# Patient Record
Sex: Female | Born: 1960
Health system: Southern US, Community
[De-identification: ages and names within clinical notes are randomized; demographics above are authoritative.]

## PROBLEM LIST (undated history)

## (undated) DIAGNOSIS — F909 Attention-deficit hyperactivity disorder, unspecified type: Secondary | ICD-10-CM

## (undated) HISTORY — PX: TYMPANOPLASTY: SHX33

## (undated) HISTORY — DX: Attention-deficit hyperactivity disorder, unspecified type: F90.9

## (undated) HISTORY — PX: DILATION AND CURETTAGE OF UTERUS: SHX78

---

## 2005-11-27 ENCOUNTER — Other Ambulatory Visit: Admission: RE | Admit: 2005-11-27 | Discharge: 2005-11-27 | Payer: Self-pay | Admitting: Gynecology

## 2006-01-13 ENCOUNTER — Encounter: Admission: RE | Admit: 2006-01-13 | Discharge: 2006-01-13 | Payer: Self-pay | Admitting: Gynecology

## 2006-11-30 ENCOUNTER — Other Ambulatory Visit: Admission: RE | Admit: 2006-11-30 | Discharge: 2006-11-30 | Payer: Self-pay | Admitting: Gynecology

## 2008-03-05 ENCOUNTER — Other Ambulatory Visit: Admission: RE | Admit: 2008-03-05 | Discharge: 2008-03-05 | Payer: Self-pay | Admitting: Gynecology

## 2008-03-23 ENCOUNTER — Encounter: Admission: RE | Admit: 2008-03-23 | Discharge: 2008-03-23 | Payer: Self-pay | Admitting: Gynecology

## 2008-05-06 ENCOUNTER — Emergency Department (HOSPITAL_COMMUNITY): Admission: EM | Admit: 2008-05-06 | Discharge: 2008-05-06 | Payer: Self-pay | Admitting: Family Medicine

## 2008-10-13 ENCOUNTER — Emergency Department (HOSPITAL_COMMUNITY): Admission: EM | Admit: 2008-10-13 | Discharge: 2008-10-13 | Payer: Self-pay | Admitting: Family Medicine

## 2008-11-12 ENCOUNTER — Emergency Department (HOSPITAL_COMMUNITY): Admission: EM | Admit: 2008-11-12 | Discharge: 2008-11-12 | Payer: Self-pay | Admitting: Emergency Medicine

## 2009-05-30 ENCOUNTER — Encounter: Admission: RE | Admit: 2009-05-30 | Discharge: 2009-05-30 | Payer: Self-pay | Admitting: Gynecology

## 2009-05-30 ENCOUNTER — Ambulatory Visit: Payer: Self-pay | Admitting: Gynecology

## 2009-05-30 ENCOUNTER — Other Ambulatory Visit: Admission: RE | Admit: 2009-05-30 | Discharge: 2009-05-30 | Payer: Self-pay | Admitting: Gynecology

## 2009-05-30 ENCOUNTER — Encounter: Payer: Self-pay | Admitting: Gynecology

## 2011-01-11 ENCOUNTER — Encounter: Payer: Self-pay | Admitting: Gynecology

## 2011-07-26 ENCOUNTER — Inpatient Hospital Stay (INDEPENDENT_AMBULATORY_CARE_PROVIDER_SITE_OTHER)
Admission: RE | Admit: 2011-07-26 | Discharge: 2011-07-26 | Disposition: A | Discharge status: Home / Self Care | Payer: Self-pay | Source: Ambulatory Visit | Attending: Family Medicine | Admitting: Family Medicine

## 2011-07-26 DIAGNOSIS — H9209 Otalgia, unspecified ear: Secondary | ICD-10-CM

## 2011-08-02 ENCOUNTER — Inpatient Hospital Stay (INDEPENDENT_AMBULATORY_CARE_PROVIDER_SITE_OTHER)
Admission: RE | Admit: 2011-08-02 | Discharge: 2011-08-02 | Disposition: A | Discharge status: Home / Self Care | Payer: BC Managed Care – PPO | Source: Ambulatory Visit | Attending: Family Medicine | Admitting: Family Medicine

## 2011-08-02 DIAGNOSIS — H6690 Otitis media, unspecified, unspecified ear: Secondary | ICD-10-CM

## 2013-07-13 ENCOUNTER — Other Ambulatory Visit: Payer: Self-pay

## 2013-07-13 DIAGNOSIS — Z1231 Encounter for screening mammogram for malignant neoplasm of breast: Secondary | ICD-10-CM

## 2013-08-04 ENCOUNTER — Ambulatory Visit: Payer: Self-pay

## 2013-08-11 ENCOUNTER — Ambulatory Visit
Admission: RE | Admit: 2013-08-11 | Discharge: 2013-08-11 | Disposition: A | Discharge status: Home / Self Care | Payer: BC Managed Care – PPO | Source: Ambulatory Visit

## 2013-08-11 DIAGNOSIS — Z1231 Encounter for screening mammogram for malignant neoplasm of breast: Secondary | ICD-10-CM

## 2013-08-15 ENCOUNTER — Other Ambulatory Visit: Payer: Self-pay | Admitting: Gynecology

## 2013-08-15 DIAGNOSIS — R928 Other abnormal and inconclusive findings on diagnostic imaging of breast: Secondary | ICD-10-CM

## 2013-08-17 ENCOUNTER — Ambulatory Visit (INDEPENDENT_AMBULATORY_CARE_PROVIDER_SITE_OTHER): Payer: BC Managed Care – PPO | Admitting: Gynecology

## 2013-08-17 ENCOUNTER — Other Ambulatory Visit (HOSPITAL_COMMUNITY)
Admission: RE | Admit: 2013-08-17 | Discharge: 2013-08-17 | Disposition: A | Discharge status: Home / Self Care | Payer: BC Managed Care – PPO | Source: Ambulatory Visit | Attending: Gynecology | Admitting: Gynecology

## 2013-08-17 ENCOUNTER — Encounter: Payer: Self-pay | Admitting: Gynecology

## 2013-08-17 VITALS — BP 120/76 | Ht 68.0 in | Wt 168.0 lb

## 2013-08-17 DIAGNOSIS — Z1151 Encounter for screening for human papillomavirus (HPV): Secondary | ICD-10-CM | POA: Insufficient documentation

## 2013-08-17 DIAGNOSIS — N926 Irregular menstruation, unspecified: Secondary | ICD-10-CM

## 2013-08-17 DIAGNOSIS — Z01419 Encounter for gynecological examination (general) (routine) without abnormal findings: Secondary | ICD-10-CM | POA: Insufficient documentation

## 2013-08-17 DIAGNOSIS — N951 Menopausal and female climacteric states: Secondary | ICD-10-CM

## 2013-08-17 MED ORDER — PROGESTERONE MICRONIZED 200 MG PO CAPS: 200.0000 mg | ORAL_CAPSULE | Freq: Every day | ORAL | Status: DC

## 2013-08-17 MED ORDER — ESTRADIOL 0.1 MG/24HR TD PTTW: 1.0000 | MEDICATED_PATCH | TRANSDERMAL | Status: DC

## 2013-08-17 NOTE — Progress Notes (Signed)
Nicole Boyd 10-26-1961 [id number]        52 y.o.  G0P0 for annual exam.  Has not been seen in over 3 years. Several issues noted below.  Past medical history,surgical history, medications, allergies, family history and social history were all reviewed and documented in the EPIC chart.  ROS:  Performed and pertinent positives and negatives are included in the history, assessment and plan .  Exam: Kim assistant Filed Vitals:   08/17/13 1001  BP: 120/76  Height: 5\' 8"  (1.727 m)  Weight: 168 lb (76.204 kg)   General appearance  Normal Skin grossly normal Head/Neck normal with no cervical or supraclavicular adenopathy thyroid normal Lungs  clear Cardiac RR, without RMG Abdominal  soft, nontender, without masses, organomegaly or hernia Breasts  examined lying and sitting without masses, retractions, discharge or axillary adenopathy. Pelvic  Ext/BUS/vagina  normal  Cervix  normal Pap/HPV  Uterus  anteverted, normal size, shape and contour, midline and mobile nontender   Adnexa  Without masses or tenderness    Anus and perineum  normal   Rectovaginal  normal sphincter tone without palpated masses or tenderness.    Assessment/Plan:  52 y.o. G0P0 female for annual exam, irregular menses, not sexually active.   1. Irregular menses/menopausal symptoms. Over the past year or so patient notes increasing hot flushes, night sweats, sleep intolerance, emotional swings and skips in her menses up to several months. Are regular when they come with no prolonged or atypical bleeding. Reviewed perimenopausal status. We'll check baseline FSH TSH for completeness. Options for management to include observation, OTC products, HRT discussed.  I reviewed the whole issue of HRT with her to include the WHI study with increased risk of stroke, heart attack, DVT and breast cancer. The ACOG and NAMS statements for lowest dose for the shortest period of time reviewed. Transdermal versus oral first-pass effect  benefit discussed. After lengthy discussion the patient wants to start on HRT. I suggested Minivelle 0.1 mg patches and Prometrium 200 mg daily for the first 12 days of each month. As she is having some withdrawal bleeding will try to regulate her this coming year. We'll consider switching to a continuous regimen once we get further into the menopause. Notes father does have history of DVT with no other family history. Occurred when he was elderly and he was in poor health with long history of cigarette smoking. Possible genetic linkage discuss but with out other family members and given this situation feel no further evaluation needed. Patient agrees and does accept the increased risk of thrombosis associated with HRT. 2. Pap smear 2010. Pap smear/HPV done today. No history of abnormal Pap smears previously. Assuming normal then we'll plan less frequent screening intervals every 3-5 years. 3. Mammography 07/2013. Patient going back for additional views and has this scheduled rule out mass. Exam is normal today. Assuming negative followup views and plan annual mammography. SBE monthly reviewed. 4. Colonoscopy never. Recommended screening colonoscopy this coming year. 5. Health maintenance. No other blood work done as it is reportedly done through her primary physician's office. Followup one year, sooner as needed.  Note: This document was prepared with digital dictation and possible smart phrase technology. Any transcriptional errors that result from this process are unintentional.   Dara Lords MD, 10:57 AM 08/17/2013

## 2013-08-17 NOTE — Patient Instructions (Addendum)
Start on hormone replacement as we discussed. Call me if you have any issues or irregular bleeding. Call me regardless to see how you're doing in 3-6 months. Followup in one year for annual exam. Schedule colonoscopy with [facility] gastroenterology at [phone] or [facility] gastroenterology at [phone]

## 2013-08-22 ENCOUNTER — Telehealth: Payer: Self-pay

## 2013-08-22 NOTE — Telephone Encounter (Signed)
Patient said she has already started using the patch the day she was here. She said the day that she started that she began with hot flashes. It had been awhile since she had them.  She wondered if this was a side affect of the patch with starting it.  I told her I had never heard of that but I would check with you. She has continued with hotflashes daily.

## 2013-08-22 NOTE — Telephone Encounter (Signed)
Is not a side effect of the patch. The patch should be making her hot flushes better. It usually takes several weeks to completely take effect and it may just be coincidence that her hot flashes got worse.

## 2013-08-22 NOTE — Telephone Encounter (Signed)
Message copied by Keenan Bachelor on Tue Aug 22, 2013 11:31 AM ------      Message from: Dara Lords      Created: Fri Aug 18, 2013  8:50 AM       Tell patient that her [facility] is mildly elevated to assist with the beginnings of menopause which all make sense given her clinical picture. I would start HRT as we discussed and followup as we discussed. ------

## 2013-08-22 NOTE — Telephone Encounter (Signed)
Patient advised.

## 2013-08-29 ENCOUNTER — Ambulatory Visit
Admission: RE | Admit: 2013-08-29 | Discharge: 2013-08-29 | Disposition: A | Discharge status: Home / Self Care | Payer: BC Managed Care – PPO | Source: Ambulatory Visit | Attending: Gynecology | Admitting: Gynecology

## 2013-08-29 DIAGNOSIS — R928 Other abnormal and inconclusive findings on diagnostic imaging of breast: Secondary | ICD-10-CM

## 2013-09-05 ENCOUNTER — Other Ambulatory Visit: Payer: BC Managed Care – PPO

## 2014-02-16 ENCOUNTER — Telehealth: Payer: Self-pay | Admitting: *Deleted

## 2014-02-16 NOTE — Telephone Encounter (Signed)
Pt called requesting another coupon for minivelle patch, I mail coupon to patient,old one had expired.

## 2014-05-22 ENCOUNTER — Telehealth: Payer: Self-pay | Admitting: *Deleted

## 2014-05-22 NOTE — Telephone Encounter (Signed)
Spoke with patient about the below note and decided to monitor symptoms,but pt states she never started the Prometrium day 1-12 monthly. I explained to patient that she really needs to take this. Pt said she did remember about this medication, so I explained to patient. I told her I would relay to you. Pt is going to stick with patch and start taking Prometrium today.

## 2014-05-22 NOTE — Telephone Encounter (Signed)
That is the highest dose for the patch. To increase the estrogen she would need to switch to the pills which have a slightly higher risk of blood clots. If she wants to do that then I would prescribe estradiol 1 mg #60 and take 1-1/2 pill daily along with her Prometrium and she has been doing. Alternative would be to monitor her symptoms a little longer to see if this does not improve on their own.

## 2014-05-22 NOTE — Telephone Encounter (Signed)
Pt currently taking vivelle dot patches 0.1 mg noticed hot flashes x1 week and night sweats, problems with sleep x 1 month and slight headaches, feels emotional did mention somewhat stressed. Pt asked if increase in dose should be prescribed? Please advise

## 2014-05-23 MED ORDER — PROGESTERONE MICRONIZED 200 MG PO CAPS: 200.0000 mg | ORAL_CAPSULE | Freq: Every day | ORAL | Status: DC

## 2014-06-15 ENCOUNTER — Telehealth: Payer: Self-pay | Admitting: *Deleted

## 2014-06-15 NOTE — Telephone Encounter (Signed)
Just FYI Pt currently taking estradiol patch 0.1 mg and progesterone 200 mg day 1-12 pt is going to stop HRT. Has annual in September 2015, will follow up with you then. She would like to see how body does without HRT.

## 2014-07-06 ENCOUNTER — Other Ambulatory Visit: Payer: Self-pay | Admitting: Gastroenterology

## 2014-07-31 ENCOUNTER — Other Ambulatory Visit: Payer: Self-pay

## 2014-07-31 DIAGNOSIS — Z1231 Encounter for screening mammogram for malignant neoplasm of breast: Secondary | ICD-10-CM

## 2014-08-16 ENCOUNTER — Ambulatory Visit
Admission: RE | Admit: 2014-08-16 | Discharge: 2014-08-16 | Disposition: A | Discharge status: Home / Self Care | Payer: BC Managed Care – PPO | Source: Ambulatory Visit

## 2014-08-16 DIAGNOSIS — Z1231 Encounter for screening mammogram for malignant neoplasm of breast: Secondary | ICD-10-CM

## 2014-08-23 ENCOUNTER — Ambulatory Visit (INDEPENDENT_AMBULATORY_CARE_PROVIDER_SITE_OTHER): Payer: BC Managed Care – PPO | Admitting: Gynecology

## 2014-08-23 ENCOUNTER — Encounter: Payer: Self-pay | Admitting: Gynecology

## 2014-08-23 VITALS — BP 116/70 | Ht 68.0 in | Wt 148.0 lb

## 2014-08-23 DIAGNOSIS — Z01419 Encounter for gynecological examination (general) (routine) without abnormal findings: Secondary | ICD-10-CM

## 2014-08-23 NOTE — Patient Instructions (Signed)
You may obtain a copy of any labs that were done today by logging onto MyChart as outlined in the instructions provided with your AVS (after visit summary). The office will not call with normal lab results but certainly if there are any significant abnormalities then we will contact you.   Health Maintenance, Female A healthy lifestyle and preventative care can promote health and wellness.  Maintain regular health, dental, and eye exams.  Eat a healthy diet. Foods like vegetables, fruits, whole grains, low-fat dairy products, and lean protein foods contain the nutrients you need without too many calories. Decrease your intake of foods high in solid fats, added sugars, and salt. Get information about a proper diet from your caregiver, if necessary.  Regular physical exercise is one of the most important things you can do for your health. Most adults should get at least 150 minutes of moderate-intensity exercise (any activity that increases your heart rate and causes you to sweat) each week. In addition, most adults need muscle-strengthening exercises on 2 or more days a week.   Maintain a healthy weight. The body mass index (BMI) is a screening tool to identify possible weight problems. It provides an estimate of body fat based on height and weight. Your caregiver can help determine your BMI, and can help you achieve or maintain a healthy weight. For adults 20 years and older:  A BMI below 18.5 is considered underweight.  A BMI of 18.5 to 24.9 is normal.  A BMI of 25 to 29.9 is considered overweight.  A BMI of 30 and above is considered obese.  Maintain normal blood lipids and cholesterol by exercising and minimizing your intake of saturated fat. Eat a balanced diet with plenty of fruits and vegetables. Blood tests for lipids and cholesterol should begin at age 61 and be repeated every 5 years. If your lipid or cholesterol levels are high, you are over 50, or you are a high risk for heart  disease, you may need your cholesterol levels checked more frequently.Ongoing high lipid and cholesterol levels should be treated with medicines if diet and exercise are not effective.  If you smoke, find out from your caregiver how to quit. If you do not use tobacco, do not start.  Lung cancer screening is recommended for adults aged 33 80 years who are at high risk for developing lung cancer because of a history of smoking. Yearly low-dose computed tomography (CT) is recommended for people who have at least a 30-pack-year history of smoking and are a current smoker or have quit within the past 15 years. A pack year of smoking is smoking an average of 1 pack of cigarettes a day for 1 year (for example: 1 pack a day for 30 years or 2 packs a day for 15 years). Yearly screening should continue until the smoker has stopped smoking for at least 15 years. Yearly screening should also be stopped for people who develop a health problem that would prevent them from having lung cancer treatment.  If you are pregnant, do not drink alcohol. If you are breastfeeding, be very cautious about drinking alcohol. If you are not pregnant and choose to drink alcohol, do not exceed 1 drink per day. One drink is considered to be 12 ounces (355 mL) of beer, 5 ounces (148 mL) of wine, or 1.5 ounces (44 mL) of liquor.  Avoid use of street drugs. Do not share needles with anyone. Ask for help if you need support or instructions about stopping  the use of drugs.  High blood pressure causes heart disease and increases the risk of stroke. Blood pressure should be checked at least every 1 to 2 years. Ongoing high blood pressure should be treated with medicines, if weight loss and exercise are not effective.  If you are 59 to 51 years old, ask your caregiver if you should take aspirin to prevent strokes.  Diabetes screening involves taking a blood sample to check your fasting blood sugar level. This should be done once every 3  years, after age 91, if you are within normal weight and without risk factors for diabetes. Testing should be considered at a younger age or be carried out more frequently if you are overweight and have at least 1 risk factor for diabetes.  Breast cancer screening is essential preventative care for women. You should practice "breast self-awareness." This means understanding the normal appearance and feel of your breasts and may include breast self-examination. Any changes detected, no matter how small, should be reported to a caregiver. Women in their 66s and 30s should have a clinical breast exam (CBE) by a caregiver as part of a regular health exam every 1 to 3 years. After age 101, women should have a CBE every year. Starting at age 100, women should consider having a mammogram (breast X-ray) every year. Women who have a family history of breast cancer should talk to their caregiver about genetic screening. Women at a high risk of breast cancer should talk to their caregiver about having an MRI and a mammogram every year.  Breast cancer gene (BRCA)-related cancer risk assessment is recommended for women who have family members with BRCA-related cancers. BRCA-related cancers include breast, ovarian, tubal, and peritoneal cancers. Having family members with these cancers may be associated with an increased risk for harmful changes (mutations) in the breast cancer genes BRCA1 and BRCA2. Results of the assessment will determine the need for genetic counseling and BRCA1 and BRCA2 testing.  The Pap test is a screening test for cervical cancer. Women should have a Pap test starting at age 57. Between ages 25 and 35, Pap tests should be repeated every 2 years. Beginning at age 37, you should have a Pap test every 3 years as long as the past 3 Pap tests have been normal. If you had a hysterectomy for a problem that was not cancer or a condition that could lead to cancer, then you no longer need Pap tests. If you are  between ages 50 and 76, and you have had normal Pap tests going back 10 years, you no longer need Pap tests. If you have had past treatment for cervical cancer or a condition that could lead to cancer, you need Pap tests and screening for cancer for at least 20 years after your treatment. If Pap tests have been discontinued, risk factors (such as a new sexual partner) need to be reassessed to determine if screening should be resumed. Some women have medical problems that increase the chance of getting cervical cancer. In these cases, your caregiver may recommend more frequent screening and Pap tests.  The human papillomavirus (HPV) test is an additional test that may be used for cervical cancer screening. The HPV test looks for the virus that can cause the cell changes on the cervix. The cells collected during the Pap test can be tested for HPV. The HPV test could be used to screen women aged 44 years and older, and should be used in women of any age  who have unclear Pap test results. After the age of 55, women should have HPV testing at the same frequency as a Pap test.  Colorectal cancer can be detected and often prevented. Most routine colorectal cancer screening begins at the age of 44 and continues through age 20. However, your caregiver may recommend screening at an earlier age if you have risk factors for colon cancer. On a yearly basis, your caregiver may provide home test kits to check for hidden blood in the stool. Use of a small camera at the end of a tube, to directly examine the colon (sigmoidoscopy or colonoscopy), can detect the earliest forms of colorectal cancer. Talk to your caregiver about this at age 86, when routine screening begins. Direct examination of the colon should be repeated every 5 to 10 years through age 13, unless early forms of pre-cancerous polyps or small growths are found.  Hepatitis C blood testing is recommended for all people born from [birth year] through 1963 and any  individual with known risks for hepatitis C.  Practice safe sex. Use condoms and avoid high-risk sexual practices to reduce the spread of sexually transmitted infections (STIs). Sexually active women aged 36 and younger should be checked for Chlamydia, which is a common sexually transmitted infection. Older women with new or multiple partners should also be tested for Chlamydia. Testing for other STIs is recommended if you are sexually active and at increased risk.  Osteoporosis is a disease in which the bones lose minerals and strength with aging. This can result in serious bone fractures. The risk of osteoporosis can be identified using a bone density scan. Women ages 20 and over and women at risk for fractures or osteoporosis should discuss screening with their caregivers. Ask your caregiver whether you should be taking a calcium supplement or vitamin D to reduce the rate of osteoporosis.  Menopause can be associated with physical symptoms and risks. Hormone replacement therapy is available to decrease symptoms and risks. You should talk to your caregiver about whether hormone replacement therapy is right for you.  Use sunscreen. Apply sunscreen liberally and repeatedly throughout the day. You should seek shade when your shadow is shorter than you. Protect yourself by wearing long sleeves, pants, a wide-brimmed hat, and sunglasses year round, whenever you are outdoors.  Notify your caregiver of new moles or changes in moles, especially if there is a change in shape or color. Also notify your caregiver if a mole is larger than the size of a pencil eraser.  Stay current with your immunizations. Document Released: 06/22/2011 Document Revised: 04/03/2013 Document Reviewed: 06/22/2011 [facility] Patient Information 2014 [employer].

## 2014-08-23 NOTE — Progress Notes (Signed)
Kaysey Berndt Bents May 21, 1961 [id number]        53 y.o.  G0P0 for annual exam.  Doing well without complaints.  Past medical history,surgical history, problem list, medications, allergies, family history and social history were all reviewed and documented as reviewed in the EPIC chart.  ROS:  12 system ROS performed with pertinent positives and negatives included in the history, assessment and plan.   Additional significant findings :  None   Exam: Kim assistant Filed Vitals:   08/23/14 0844  BP: 116/70  Height: 5\' 8"  (1.727 m)  Weight: 148 lb (67.132 kg)   General appearance:  Normal affect, orientation and appearance. Skin: Grossly normal HEENT: Without gross lesions.  No cervical or supraclavicular adenopathy. Thyroid normal.  Lungs:  Clear without wheezing, rales or rhonchi Cardiac: RR, without RMG Abdominal:  Soft, nontender, without masses, guarding, rebound, organomegaly or hernia Breasts:  Examined lying and sitting without masses, retractions, discharge or axillary adenopathy. Pelvic:  Ext/BUS/vagina normal  Cervix normal  Uterus anteverted, normal size, shape and contour, midline and mobile nontender   Adnexa  Without masses or tenderness    Anus and perineum  Normal   Rectovaginal  Normal sphincter tone without palpated masses or tenderness.    Assessment/Plan:  53 y.o. G0P0 female for annual exam.   1. Postmenopausal. Without significant hot flushes, night sweats, vaginal dryness or dyspareunia. Transiently tried HRT last year but discontinued. Doing well and does not want to start. No vaginal bleeding for over one year. Continue to monitor and report any vaginal bleeding. 2. Pap smear/HPV negative  2014. No Pap smear done today. No history of significant abnormal Pap smears. Plan repeat at 3-5 year interval per current screening guidelines. 3. Mammography 07/2014. Continue with annual mammography. SBE monthly reviewed. 4. Colonoscopy 2015. Repeat at their recommended  interval. 5. DEXA. Plan further into the menopause. Increase calcium vitamin D recommendations reviewed. 6. Health maintenance. No blood work done as she has this done at her primary physician's office. Followup one year, sooner as needed.    Note: This document was prepared with digital dictation and possible smart phrase technology. Any transcriptional errors that result from this process are unintentional.   [name] MD, 9:15 AM 08/23/2014

## 2014-08-24 LAB — URINALYSIS W MICROSCOPIC + REFLEX CULTURE
BILIRUBIN URINE: NEGATIVE
Bacteria, UA: NONE SEEN
CASTS: NONE SEEN
CRYSTALS: NONE SEEN
Glucose, UA: NEGATIVE mg/dL
Hgb urine dipstick: NEGATIVE
KETONES UR: NEGATIVE mg/dL
LEUKOCYTES UA: NEGATIVE
NITRITE: NEGATIVE
Protein, ur: NEGATIVE mg/dL
SPECIFIC GRAVITY, URINE: 1.02 (ref 1.005–1.030)
SQUAMOUS EPITHELIAL / LPF: NONE SEEN
Urobilinogen, UA: 0.2 mg/dL (ref 0.0–1.0)
pH: 5.5 (ref 5.0–8.0)

## 2015-06-10 ENCOUNTER — Telehealth: Payer: Self-pay | Admitting: *Deleted

## 2015-06-10 NOTE — Telephone Encounter (Signed)
Nicole Boyd

## 2015-06-10 NOTE — Telephone Encounter (Signed)
Pt informed with the below note. 

## 2015-06-10 NOTE — Telephone Encounter (Signed)
Pt called c/o increased hot flashes was on HRT in past but stopped. Pt said she doesn't want to take a Rx,but asked if you know of any OTC medication that may help? Please advise

## 2015-11-06 ENCOUNTER — Encounter: Payer: Self-pay | Admitting: Gynecology

## 2015-11-22 ENCOUNTER — Other Ambulatory Visit: Payer: Self-pay

## 2015-11-22 DIAGNOSIS — Z1231 Encounter for screening mammogram for malignant neoplasm of breast: Secondary | ICD-10-CM

## 2015-12-02 ENCOUNTER — Encounter (HOSPITAL_BASED_OUTPATIENT_CLINIC_OR_DEPARTMENT_OTHER): Payer: Self-pay | Admitting: *Deleted

## 2015-12-03 ENCOUNTER — Other Ambulatory Visit: Payer: Self-pay | Admitting: Orthopedic Surgery

## 2015-12-05 ENCOUNTER — Ambulatory Visit (HOSPITAL_BASED_OUTPATIENT_CLINIC_OR_DEPARTMENT_OTHER): Payer: 59 | Admitting: Anesthesiology

## 2015-12-05 ENCOUNTER — Ambulatory Visit (HOSPITAL_BASED_OUTPATIENT_CLINIC_OR_DEPARTMENT_OTHER)
Admission: RE | Admit: 2015-12-05 | Discharge: 2015-12-05 | Disposition: A | Discharge status: Home / Self Care | Payer: 59 | Source: Ambulatory Visit | Attending: Orthopedic Surgery | Admitting: Orthopedic Surgery

## 2015-12-05 ENCOUNTER — Encounter (HOSPITAL_BASED_OUTPATIENT_CLINIC_OR_DEPARTMENT_OTHER)
Admission: RE | Disposition: A | Discharge status: Home / Self Care | Payer: Self-pay | Source: Ambulatory Visit | Attending: Orthopedic Surgery

## 2015-12-05 ENCOUNTER — Encounter (HOSPITAL_BASED_OUTPATIENT_CLINIC_OR_DEPARTMENT_OTHER): Payer: Self-pay | Admitting: *Deleted

## 2015-12-05 DIAGNOSIS — G5601 Carpal tunnel syndrome, right upper limb: Secondary | ICD-10-CM | POA: Diagnosis present

## 2015-12-05 HISTORY — PX: CARPAL TUNNEL RELEASE: SHX101

## 2015-12-05 SURGERY — CARPAL TUNNEL RELEASE
Anesthesia: Monitor Anesthesia Care | Site: Hand | Laterality: Right

## 2015-12-05 MED ORDER — PROPOFOL 10 MG/ML IV BOLUS
INTRAVENOUS | Status: AC
  Filled 2015-12-05: qty 20

## 2015-12-05 MED ORDER — FENTANYL CITRATE (PF) 100 MCG/2ML IJ SOLN: 25.0000 ug | INTRAMUSCULAR | Status: DC | PRN

## 2015-12-05 MED ORDER — CEFAZOLIN SODIUM-DEXTROSE 2-3 GM-% IV SOLR: 2.0000 g | INTRAVENOUS | Status: DC

## 2015-12-05 MED ORDER — ONDANSETRON HCL 4 MG/2ML IJ SOLN
INTRAMUSCULAR | Status: DC | PRN
  Administered 2015-12-05: 4 mg via INTRAVENOUS

## 2015-12-05 MED ORDER — PROMETHAZINE HCL 25 MG/ML IJ SOLN: 6.2500 mg | INTRAMUSCULAR | Status: DC | PRN

## 2015-12-05 MED ORDER — LACTATED RINGERS IV SOLN
INTRAVENOUS | Status: DC
  Administered 2015-12-05: 11:00:00 via INTRAVENOUS

## 2015-12-05 MED ORDER — CHLORHEXIDINE GLUCONATE 4 % EX LIQD: 60.0000 mL | Freq: Once | CUTANEOUS | Status: DC

## 2015-12-05 MED ORDER — BUPIVACAINE HCL (PF) 0.25 % IJ SOLN
INTRAMUSCULAR | Status: AC
  Filled 2015-12-05: qty 120

## 2015-12-05 MED ORDER — HYDROCODONE-ACETAMINOPHEN 5-325 MG PO TABS
ORAL_TABLET | ORAL | Status: AC
  Filled 2015-12-05: qty 1

## 2015-12-05 MED ORDER — SCOPOLAMINE 1 MG/3DAYS TD PT72: 1.0000 | MEDICATED_PATCH | Freq: Once | TRANSDERMAL | Status: DC

## 2015-12-05 MED ORDER — MIDAZOLAM HCL 2 MG/2ML IJ SOLN
INTRAMUSCULAR | Status: AC
  Filled 2015-12-05: qty 2

## 2015-12-05 MED ORDER — PROPOFOL 500 MG/50ML IV EMUL
INTRAVENOUS | Status: DC | PRN
  Administered 2015-12-05: 75 ug/kg/min via INTRAVENOUS

## 2015-12-05 MED ORDER — HYDROCODONE-ACETAMINOPHEN 5-325 MG PO TABS
1.0000 | ORAL_TABLET | Freq: Once | ORAL | Status: AC
  Administered 2015-12-05: 1 via ORAL

## 2015-12-05 MED ORDER — CEFAZOLIN SODIUM-DEXTROSE 2-3 GM-% IV SOLR
2.0000 g | INTRAVENOUS | Status: AC
  Administered 2015-12-05: 2 g via INTRAVENOUS

## 2015-12-05 MED ORDER — CEFAZOLIN SODIUM-DEXTROSE 2-3 GM-% IV SOLR
INTRAVENOUS | Status: AC
  Filled 2015-12-05: qty 50

## 2015-12-05 MED ORDER — HYDROCODONE-ACETAMINOPHEN 5-325 MG PO TABS: 1.0000 | ORAL_TABLET | Freq: Four times a day (QID) | ORAL | Status: DC | PRN

## 2015-12-05 MED ORDER — FENTANYL CITRATE (PF) 100 MCG/2ML IJ SOLN
50.0000 ug | INTRAMUSCULAR | Status: DC | PRN
  Administered 2015-12-05: 50 ug via INTRAVENOUS

## 2015-12-05 MED ORDER — ONDANSETRON HCL 4 MG/2ML IJ SOLN
INTRAMUSCULAR | Status: AC
  Filled 2015-12-05: qty 2

## 2015-12-05 MED ORDER — FENTANYL CITRATE (PF) 100 MCG/2ML IJ SOLN
INTRAMUSCULAR | Status: AC
  Filled 2015-12-05: qty 2

## 2015-12-05 MED ORDER — BUPIVACAINE HCL (PF) 0.25 % IJ SOLN
INTRAMUSCULAR | Status: DC | PRN
  Administered 2015-12-05: 5 mL

## 2015-12-05 MED ORDER — MIDAZOLAM HCL 2 MG/2ML IJ SOLN
1.0000 mg | INTRAMUSCULAR | Status: DC | PRN
  Administered 2015-12-05 (×2): 1 mg via INTRAVENOUS

## 2015-12-05 MED ORDER — GLYCOPYRROLATE 0.2 MG/ML IJ SOLN: 0.2000 mg | Freq: Once | INTRAMUSCULAR | Status: DC | PRN

## 2015-12-05 SURGICAL SUPPLY — 33 items
BLADE SURG 15 STRL LF DISP TIS (BLADE) ×1 IMPLANT
BLADE SURG 15 STRL SS (BLADE) ×3
BNDG CMPR 9X4 STRL LF SNTH (GAUZE/BANDAGES/DRESSINGS)
BNDG COHESIVE 3X5 TAN STRL LF (GAUZE/BANDAGES/DRESSINGS) ×3 IMPLANT
BNDG ESMARK 4X9 LF (GAUZE/BANDAGES/DRESSINGS) IMPLANT
BNDG GAUZE ELAST 4 BULKY (GAUZE/BANDAGES/DRESSINGS) ×3 IMPLANT
CHLORAPREP W/TINT 26ML (MISCELLANEOUS) ×3 IMPLANT
CORDS BIPOLAR (ELECTRODE) ×3 IMPLANT
COVER BACK TABLE 60X90IN (DRAPES) ×3 IMPLANT
COVER MAYO STAND STRL (DRAPES) ×3 IMPLANT
CUFF TOURNIQUET SINGLE 18IN (TOURNIQUET CUFF) ×3 IMPLANT
DRAPE EXTREMITY T 121X128X90 (DRAPE) ×3 IMPLANT
DRAPE SURG 17X23 STRL (DRAPES) ×3 IMPLANT
DRSG PAD ABDOMINAL 8X10 ST (GAUZE/BANDAGES/DRESSINGS) ×3 IMPLANT
GAUZE SPONGE 4X4 12PLY STRL (GAUZE/BANDAGES/DRESSINGS) ×3 IMPLANT
GAUZE XEROFORM 1X8 LF (GAUZE/BANDAGES/DRESSINGS) ×3 IMPLANT
GLOVE BIOGEL PI IND STRL 8.5 (GLOVE) ×1 IMPLANT
GLOVE BIOGEL PI INDICATOR 8.5 (GLOVE) ×2
GLOVE SURG ORTHO 8.0 STRL STRW (GLOVE) ×3 IMPLANT
GOWN STRL REUS W/ TWL LRG LVL3 (GOWN DISPOSABLE) ×1 IMPLANT
GOWN STRL REUS W/TWL LRG LVL3 (GOWN DISPOSABLE) ×3
GOWN STRL REUS W/TWL XL LVL3 (GOWN DISPOSABLE) ×3 IMPLANT
NDL PRECISIONGLIDE 27X1.5 (NEEDLE) IMPLANT
NEEDLE PRECISIONGLIDE 27X1.5 (NEEDLE) ×3 IMPLANT
NS IRRIG 1000ML POUR BTL (IV SOLUTION) ×3 IMPLANT
PACK BASIN DAY SURGERY FS (CUSTOM PROCEDURE TRAY) ×3 IMPLANT
STOCKINETTE 4X48 STRL (DRAPES) ×3 IMPLANT
SUT ETHILON 4 0 PS 2 18 (SUTURE) ×3 IMPLANT
SUT VICRYL 4-0 PS2 18IN ABS (SUTURE) IMPLANT
SYR BULB 3OZ (MISCELLANEOUS) ×3 IMPLANT
SYR CONTROL 10ML LL (SYRINGE) ×2 IMPLANT
TOWEL OR 17X24 6PK STRL BLUE (TOWEL DISPOSABLE) ×3 IMPLANT
UNDERPAD 30X30 (UNDERPADS AND DIAPERS) ×3 IMPLANT

## 2015-12-05 NOTE — Anesthesia Postprocedure Evaluation (Signed)
Anesthesia Post Note  Patient: Jesse Mattson Zellner  Procedure(s) Performed: Procedure(s) (LRB): CARPAL TUNNEL RELEASE (Right)  Patient location during evaluation: PACU Anesthesia Type: MAC and Bier Block Level of consciousness: awake and alert Pain management: pain level controlled Vital Signs Assessment: post-procedure vital signs reviewed and stable Respiratory status: spontaneous breathing, nonlabored ventilation, respiratory function stable and patient connected to nasal cannula oxygen Cardiovascular status: stable and blood pressure returned to baseline Anesthetic complications: no    Last Vitals:  Filed Vitals:   12/05/15 1026 12/05/15 1245  BP: 103/66   Pulse: 62 55  Temp: 36.6 C 36.9 C  Resp: 16 11    Last Pain:  Filed Vitals:   12/05/15 1250  PainSc: [place] Turk

## 2015-12-05 NOTE — Transfer of Care (Signed)
Immediate Anesthesia Transfer of Care Note  Patient: Nicole Boyd  Procedure(s) Performed: Procedure(s): CARPAL TUNNEL RELEASE (Right)  Patient Location: PACU  Anesthesia Type:Bier block  Level of Consciousness: awake, alert , oriented and patient cooperative  Airway & Oxygen Therapy: Patient Spontanous Breathing  Post-op Assessment: Report given to RN and Post -op Vital signs reviewed and stable  Post vital signs: Reviewed and stable  Last Vitals:  Filed Vitals:   12/05/15 1026  BP: 103/66  Pulse: 62  Temp: 36.6 C  Resp: 16    Complications: No apparent anesthesia complications

## 2015-12-05 NOTE — Anesthesia Preprocedure Evaluation (Addendum)
Anesthesia Evaluation  Patient identified by MRN, date of birth, ID band Patient awake    Reviewed: Allergy & Precautions, NPO status , Patient's Chart, lab work & pertinent test results  History of Anesthesia Complications (+) PONV and history of anesthetic complications  Airway Mallampati: II  TM Distance: >3 FB Neck ROM: Full    Dental  (+) Teeth Intact, Dental Advisory Given   Pulmonary neg pulmonary ROS,    Pulmonary exam normal breath sounds clear to auscultation       Cardiovascular Exercise Tolerance: Good (-) hypertension(-) angina(-) CAD negative cardio ROS Normal cardiovascular exam Rhythm:Regular Rate:Normal     Neuro/Psych Carpal tunnel disease negative psych ROS   GI/Hepatic negative GI ROS, Neg liver ROS,   Endo/Other  negative endocrine ROS  Renal/GU negative Renal ROS     Musculoskeletal negative musculoskeletal ROS (+)   Abdominal   Peds  Hematology negative hematology ROS (+)   Anesthesia Other Findings Day of surgery medications reviewed with the patient.  Reproductive/Obstetrics negative OB ROS                            Anesthesia Physical Anesthesia Plan  ASA: I  Anesthesia Plan: Regional and MAC   Post-op Pain Management:    Induction: Intravenous  Airway Management Planned: Nasal Cannula  Additional Equipment:   Intra-op Plan:   Post-operative Plan:   Informed Consent: I have reviewed the patients History and Physical, chart, labs and discussed the procedure including the risks, benefits and alternatives for the proposed anesthesia with the patient or authorized representative who has indicated his/her understanding and acceptance.   Dental advisory given  Plan Discussed with:   Anesthesia Plan Comments: (Risks/benefits of regional block discussed with patient including risk of bleeding, infection, nerve damage, and possibility of failed block.   Also discussed backup plan of general anesthesia and associated risks.  Patient wishes to proceed.)       Anesthesia Quick Evaluation

## 2015-12-05 NOTE — Brief Op Note (Signed)
12/05/2015  12:38 PM  PATIENT:  Nicole Boyd  54 y.o. female  PRE-OPERATIVE DIAGNOSIS:  RIGHT CARPAL TUNNEL SYNDROME  POST-OPERATIVE DIAGNOSIS:  RIGHT CARPAL TUNNEL SYNDROME  PROCEDURE:  Procedure(s): CARPAL TUNNEL RELEASE (Right)  SURGEON:  Surgeon(s) and Role:    * Daryll Brod, MD - Primary  PHYSICIAN ASSISTANT:   ASSISTANTS: none   ANESTHESIA:   local and regional  EBL:     BLOOD ADMINISTERED:none  DRAINS: none   LOCAL MEDICATIONS USED:  BUPIVICAINE   SPECIMEN:  No Specimen  DISPOSITION OF SPECIMEN:  N/A  COUNTS:  YES  TOURNIQUET:   Total Tourniquet Time Documented: Forearm (Right) - 17 minutes Total: Forearm (Right) - 17 minutes   DICTATION: .Other Dictation: Dictation Number [id number]  PLAN OF CARE: Discharge to home after PACU  PATIENT DISPOSITION:  PACU - hemodynamically stable.

## 2015-12-05 NOTE — Discharge Instructions (Addendum)

## 2015-12-05 NOTE — H&P (Signed)
Nicole Boyd is an 55 y.o. female.   Chief Complaint: Nicole Boyd is a 54 year-old right-hand dominant former patient who has not been seen in over three years.  She comes in complaining of bilateral hand numbness, tingling and pain.  She has history of questionable carpal tunnel syndrome with early arthritis at the carpometacarpal joints of her thumbs bilaterally.  She states that this has gradually progressed.  She complains of an intermittent, moderate aching pain with a feeling of numbness to multiple fingers, right greater than left.  She has no history of diabetes or thyroid problems. She does have history of arthritis, no history of gout.  She is awakened occasionally at night. She has no history of injury to the hand or neck.  She is not taking anything for this, but has been wearing a brace.   ALLERGIES:   None.  MEDICATIONS:    Antibiotics (does not recall name) and hydrocodone (from recent ear surgery).  SURGICAL HISTORY:    Tympanoplasty with prosthetic implants (10/24/15).  FAMILY MEDICAL HISTORY:   Positive for heart disease, high blood pressure and arthritis.  SOCIAL HISTORY:     She does not smoke, drinks socially.  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  REVIEW OF SYSTEMS:   Positive for contacts, glasses, hearing loss, otherwise negative 14 points.   HPI: see above  Past Medical History  Diagnosis Date  . PONV (postoperative nausea and vomiting)     Past Surgical History  Procedure Laterality Date  . Dilation and curettage of uterus    . Tympanoplasty      Family History  Problem Relation Age of Onset  . Hypertension Mother   . Heart disease Mother   . Lung disease Mother   . Hypertension Father   . Heart disease Father   . Ovarian cancer Paternal Aunt    Social History:  reports that she has never smoked. She does not have any smokeless tobacco history on file. She reports that she drinks alcohol. She reports that she does  not use illicit drugs.  Allergies: No Known Allergies  No prescriptions prior to admission    No results found for this or any previous visit (from the past 48 hour(s)).  No results found.   Pertinent items are noted in HPI.  Blood pressure 103/66, pulse 62, temperature 97.9 F (36.6 C), temperature source Oral, resp. rate 16, height 5\' 8"  (1.727 m), weight 70.308 kg (155 lb), last menstrual period 08/14/2013, SpO2 100 %.  General appearance: alert, cooperative and appears stated age Head: Normocephalic, without obvious abnormality Neck: no JVD Resp: clear to auscultation bilaterally Cardio: regular rate and rhythm, S1, S2 normal, no murmur, click, rub or gallop GI: soft, non-tender; bowel sounds normal; no masses,  no organomegaly Extremities: numbness bilateral hands Pulses: 2+ and symmetric Skin: Skin color, texture, turgor normal. No rashes or lesions Neurologic: Grossly normal Incision/Wound: na  Assessment/Plan RADIOGRAPHS:     X-rays are deferred at this point in time.  CMC grind stress test produces no significant pain or discomfort, it is very mild in nature.  DIAGNOSIS:     carpal tunnel syndrome bilaterally.   PLAN: Release right carpal tunnel . This can be done as an outpatient. Pre, peri and post op care are discussed along with risks and complications. Patient is aware there is no guarantee with surgery, possibility of infection, injury to arteries, nerves, and tendons, incomplete relief and dystrophy. Tu Shimmel R 12/05/2015, 10:40  AM    

## 2015-12-05 NOTE — Op Note (Signed)
Dictation Number [id number]

## 2015-12-06 ENCOUNTER — Encounter (HOSPITAL_BASED_OUTPATIENT_CLINIC_OR_DEPARTMENT_OTHER): Payer: Self-pay | Admitting: Orthopedic Surgery

## 2015-12-06 NOTE — Op Note (Signed)
NAMEZYNAE, HEREK NO.:  [id number]  MEDICAL RECORD NO.:  [id number]  LOCATION:                                 FACILITY:  PHYSICIAN:  Daryll Brod, M.D.            DATE OF BIRTH:  DATE OF PROCEDURE:  12/05/2015 DATE OF DISCHARGE:                              OPERATIVE REPORT   PREOPERATIVE DIAGNOSIS:  Carpal tunnel syndrome, right hand.  POSTOPERATIVE DIAGNOSIS:  Carpal tunnel syndrome, right hand.  OPERATION:  Decompression of right median nerve.  SURGEON:  Daryll Brod, M.D.  ANESTHESIA:  Forearm-based IV regional with local infiltration.  ANESTHESIOLOGIST:  Dr. Gifford Shave.  HISTORY:  The patient is a 54 year old female with a history of bilateral carpal tunnel syndrome, very classic symptoms with numbness and tingling thumb through ring fingers, waking her at night, this has not responded to conservative treatment.  She has elected to undergo surgical decompression to the median nerve.  Pre, peri, and postoperative course have been discussed along with risks and complications.  She is aware that there is no guarantee with the surgery; possibility of infection; recurrence of injury to arteries, nerves, tendons; incomplete relief of symptoms; dystrophy.  In the preoperative area, the patient is seen, the extremity marked by both the patient and surgeon, and antibiotic given.  DESCRIPTION OF PROCEDURE:  The patient was brought to the operating room where a forearm-based IV regional anesthetic was carried out without difficulty.  She was prepped using ChloraPrep, supine position with the right arm free.  A 3-minute dry time was allowed.  Time-out taken, confirming the patient and procedure.  A longitudinal incision was made in the right palm, carried down through the subcutaneous tissue. Bleeders were electrocauterized with bipolar.  The palmar fascia was split.  Superficial palmar arch was identified.  The flexor tendon to the ring and little finger  identified.  Retractor was placed protecting median and ulnar nerve.  The carpal retinaculum was incised with sharp dissection.  Right angle and Sewall retractor were placed between skin and forearm fascia.  The fascia released for approximately 2 cm proximal to the wrist crease under direct vision.  Canal was explored.  An areaof compression to the nerve was apparent.  Motor branch entered into muscle distally, no further lesions were identified.  The wound was irrigated with saline and the skin closed with interrupted 4-0 nylon sutures.  A local infiltration with 0.25% bupivacaine without epinephrine was given, approximately 6 mL was used.  Sterile compressive dressing was applied.  On deflation of the tourniquet, all fingers were immediately pinked.  She was taken to the recovery room for observation in satisfactory condition.  She will be discharged to home to return to the Agency in 1 week, on [place].          ______________________________ Daryll Brod, M.D.     GK/MEDQ  D:  12/05/2015  T:  12/06/2015  Job:  [id number]

## 2015-12-11 ENCOUNTER — Ambulatory Visit
Admission: RE | Admit: 2015-12-11 | Discharge: 2015-12-11 | Disposition: A | Discharge status: Home / Self Care | Payer: 59 | Source: Ambulatory Visit

## 2015-12-11 DIAGNOSIS — Z1231 Encounter for screening mammogram for malignant neoplasm of breast: Secondary | ICD-10-CM

## 2016-01-03 ENCOUNTER — Encounter: Payer: Self-pay | Admitting: Gynecology

## 2016-01-03 ENCOUNTER — Ambulatory Visit (INDEPENDENT_AMBULATORY_CARE_PROVIDER_SITE_OTHER): Payer: BLUE CROSS/BLUE SHIELD | Admitting: Gynecology

## 2016-01-03 VITALS — BP 120/74 | Ht 68.0 in | Wt 155.0 lb

## 2016-01-03 DIAGNOSIS — Z01419 Encounter for gynecological examination (general) (routine) without abnormal findings: Secondary | ICD-10-CM

## 2016-01-03 DIAGNOSIS — N951 Menopausal and female climacteric states: Secondary | ICD-10-CM | POA: Diagnosis not present

## 2016-01-03 LAB — URINALYSIS W MICROSCOPIC + REFLEX CULTURE
Bacteria, UA: NONE SEEN [HPF]
Bilirubin Urine: NEGATIVE
Casts: NONE SEEN [LPF]
Crystals: NONE SEEN [HPF]
GLUCOSE, UA: NEGATIVE
Hgb urine dipstick: NEGATIVE
KETONES UR: NEGATIVE
NITRITE: NEGATIVE
PH: 5 (ref 5.0–8.0)
Protein, ur: NEGATIVE
RBC / HPF: NONE SEEN RBC/HPF (ref <=2)
Specific Gravity, Urine: 1.018 (ref 1.001–1.035)
Yeast: NONE SEEN [HPF]

## 2016-01-03 NOTE — Progress Notes (Signed)
Chenelle Elfers Kulish [birth year] [id number]        55 y.o.  G0P0  for annual exam.  Doing well.  Past medical history,surgical history, problem list, medications, allergies, family history and social history were all reviewed and documented as reviewed in the EPIC chart.  ROS:  Performed with pertinent positives and negatives included in the history, assessment and plan.   Additional significant findings :  none   Exam: [name] assistant Filed Vitals:   01/03/16 0902  BP: 120/74  Height: 5\' 8"  (1.727 m)  Weight: 155 lb (70.308 kg)   General appearance:  Normal affect, orientation and appearance. Skin: Grossly normal HEENT: Without gross lesions.  No cervical or supraclavicular adenopathy. Thyroid normal.  Lungs:  Clear without wheezing, rales or rhonchi Cardiac: RR, without RMG Abdominal:  Soft, nontender, without masses, guarding, rebound, organomegaly or hernia Breasts:  Examined lying and sitting without masses, retractions, discharge or axillary adenopathy. Pelvic:  Ext/BUS/vagina normal  Cervix normal  Uterus anteverted, normal size, shape and contour, midline and mobile nontender   Adnexa  Without masses or tenderness    Anus and perineum  Normal   Rectovaginal  Normal sphincter tone without palpated masses or tenderness.    Assessment/Plan:  55 y.o. G0P0 female for annual exam.   1. Postmenopausal/menopausal symptoms. Does continue with hot flushes and sweats.  Without significant vaginal dryness or any vaginal bleeding Transiently tried HRT but discontinued. Now using OTC Estrovin and doing better with this. I again reviewed the whole issue of HRT, WHI study and risks to include stroke heart attack DVT and breast cancer. At this point the patient does not want to take HRT but will continue with the [place]. She will call if she wants to consider restarting the HRT. Will continue to monitor for now and call if any issues or vaginal bleeding 2. Mammography 11/2015. Continue  with annual mammography when due. SBE monthly review. 3. Pap smear/HPV negative 2014.  No history of significant abnormal Pap smears. Plan repeat Pap smear in 3-5 year interval. 4. Colonoscopy 2015.  Repeat at their recommended interval. 5. DEXA never. Will plan further into the menopause. 6. Health maintenance. No routine blood work done as patient has this done at her primary physician's office. Follow up 1 year, sooner as needed.   [name] MD, 9:33 AM 01/03/2016

## 2016-01-03 NOTE — Patient Instructions (Signed)

## 2016-01-05 LAB — URINE CULTURE
Colony Count: NO GROWTH
ORGANISM ID, BACTERIA: NO GROWTH

## 2016-03-26 DIAGNOSIS — R8299 Other abnormal findings in urine: Secondary | ICD-10-CM | POA: Diagnosis not present

## 2016-03-26 DIAGNOSIS — Z0000 Encounter for general adult medical examination without abnormal findings: Secondary | ICD-10-CM | POA: Diagnosis not present

## 2016-03-26 DIAGNOSIS — N390 Urinary tract infection, site not specified: Secondary | ICD-10-CM | POA: Diagnosis not present

## 2016-04-20 DIAGNOSIS — Z1389 Encounter for screening for other disorder: Secondary | ICD-10-CM | POA: Diagnosis not present

## 2016-04-20 DIAGNOSIS — N390 Urinary tract infection, site not specified: Secondary | ICD-10-CM | POA: Diagnosis not present

## 2016-04-20 DIAGNOSIS — G5603 Carpal tunnel syndrome, bilateral upper limbs: Secondary | ICD-10-CM | POA: Diagnosis not present

## 2016-04-20 DIAGNOSIS — Z0000 Encounter for general adult medical examination without abnormal findings: Secondary | ICD-10-CM | POA: Diagnosis not present

## 2016-04-20 DIAGNOSIS — H7190 Unspecified cholesteatoma, unspecified ear: Secondary | ICD-10-CM | POA: Diagnosis not present

## 2016-05-04 DIAGNOSIS — Z1212 Encounter for screening for malignant neoplasm of rectum: Secondary | ICD-10-CM | POA: Diagnosis not present

## 2016-06-02 DIAGNOSIS — N959 Unspecified menopausal and perimenopausal disorder: Secondary | ICD-10-CM | POA: Diagnosis not present

## 2016-06-02 DIAGNOSIS — L239 Allergic contact dermatitis, unspecified cause: Secondary | ICD-10-CM | POA: Diagnosis not present

## 2016-06-18 DIAGNOSIS — F4322 Adjustment disorder with anxiety: Secondary | ICD-10-CM | POA: Diagnosis not present

## 2016-06-19 DIAGNOSIS — L821 Other seborrheic keratosis: Secondary | ICD-10-CM | POA: Diagnosis not present

## 2016-06-19 DIAGNOSIS — L718 Other rosacea: Secondary | ICD-10-CM | POA: Diagnosis not present

## 2016-06-19 DIAGNOSIS — D2372 Other benign neoplasm of skin of left lower limb, including hip: Secondary | ICD-10-CM | POA: Diagnosis not present

## 2016-06-19 DIAGNOSIS — D1801 Hemangioma of skin and subcutaneous tissue: Secondary | ICD-10-CM | POA: Diagnosis not present

## 2016-09-30 DIAGNOSIS — H9071 Mixed conductive and sensorineural hearing loss, unilateral, right ear, with unrestricted hearing on the contralateral side: Secondary | ICD-10-CM | POA: Diagnosis not present

## 2016-09-30 DIAGNOSIS — H6981 Other specified disorders of Eustachian tube, right ear: Secondary | ICD-10-CM | POA: Diagnosis not present

## 2016-11-20 DIAGNOSIS — Z23 Encounter for immunization: Secondary | ICD-10-CM | POA: Diagnosis not present

## 2016-11-25 ENCOUNTER — Other Ambulatory Visit: Payer: Self-pay | Admitting: Gynecology

## 2016-11-25 DIAGNOSIS — Z1231 Encounter for screening mammogram for malignant neoplasm of breast: Secondary | ICD-10-CM

## 2016-12-15 ENCOUNTER — Ambulatory Visit
Admission: RE | Admit: 2016-12-15 | Discharge: 2016-12-15 | Disposition: A | Discharge status: Home / Self Care | Payer: BLUE CROSS/BLUE SHIELD | Source: Ambulatory Visit | Attending: Gynecology | Admitting: Gynecology

## 2016-12-15 DIAGNOSIS — Z1231 Encounter for screening mammogram for malignant neoplasm of breast: Secondary | ICD-10-CM

## 2017-01-04 ENCOUNTER — Encounter: Payer: Self-pay | Admitting: Gynecology

## 2017-01-04 ENCOUNTER — Ambulatory Visit (INDEPENDENT_AMBULATORY_CARE_PROVIDER_SITE_OTHER): Payer: BLUE CROSS/BLUE SHIELD | Admitting: Gynecology

## 2017-01-04 VITALS — BP 118/74 | Ht 68.0 in | Wt 175.0 lb

## 2017-01-04 DIAGNOSIS — N951 Menopausal and female climacteric states: Secondary | ICD-10-CM

## 2017-01-04 DIAGNOSIS — Z01419 Encounter for gynecological examination (general) (routine) without abnormal findings: Secondary | ICD-10-CM

## 2017-01-04 NOTE — Patient Instructions (Signed)

## 2017-01-04 NOTE — Addendum Note (Signed)
Addended by: Nelva Nay on: 01/04/2017 11:56 AM   Modules accepted: Orders

## 2017-01-04 NOTE — Progress Notes (Signed)
    Nicole Boyd 07/16/1961 [id number]        56 y.o.  G0P0 for annual exam.    Past medical history,surgical history, problem list, medications, allergies, family history and social history were all reviewed and documented as reviewed in the EPIC chart.  ROS:  Performed with pertinent positives and negatives included in the history, assessment and plan.   Additional significant findings :  None   Exam: [name] assistant Vitals:   01/04/17 1045  BP: 118/74  Weight: 175 lb (79.4 kg)  Height: 5\' 8"  (1.727 m)   Body mass index is 26.61 kg/m.  General appearance:  Normal affect, orientation and appearance. Skin: Grossly normal HEENT: Without gross lesions.  No cervical or supraclavicular adenopathy. Thyroid normal.  Lungs:  Clear without wheezing, rales or rhonchi Cardiac: RR, without RMG Abdominal:  Soft, nontender, without masses, guarding, rebound, organomegaly or hernia Breasts:  Examined lying and sitting without masses, retractions, discharge or axillary adenopathy. Pelvic:  Ext, BUS, Vagina normal  Cervix normal  Uterus anteverted, normal size, shape and contour, midline and mobile nontender   Adnexa without masses or tenderness    Anus and perineum normal   Rectovaginal normal sphincter tone without palpated masses or tenderness.    Assessment/Plan:  56 y.o. G0P0 female for annual exam.   1. Postmenopausal/menopausal symptoms. Continues with some hot flushes and sweats. Had tried OTC products with some success but discontinued. Options for management up to including HRT again reviewed. Risks to include stroke heart attack DVT and breast cancer discussed. At this point patient is not interested in starting HRT. Will continue to try OTC products. She will call if this continues to be an issue as she wants to try HRT. 2. Pap smear/HPV 2014. Pap smear done today. No history of significant abnormal Pap smears. 3. Mammography 11/2016. Continue with annual mammography  when due. SBE monthly reviewed. 4. Colonoscopy 2015. Repeat at their recommended interval. 5. DEXA never. Will plan further into the menopause. 6. Health maintenance. No routine lab work done as patient reports this done elsewhere. Follow up 1 year, sooner as needed.   [name] MD, 11:00 AM 01/04/2017

## 2017-01-05 LAB — PAP IG W/ RFLX HPV ASCU

## 2017-11-10 ENCOUNTER — Other Ambulatory Visit: Payer: Self-pay | Admitting: Gynecology

## 2017-11-10 DIAGNOSIS — Z1231 Encounter for screening mammogram for malignant neoplasm of breast: Secondary | ICD-10-CM

## 2017-12-16 ENCOUNTER — Ambulatory Visit
Admission: RE | Admit: 2017-12-16 | Discharge: 2017-12-16 | Disposition: A | Discharge status: Home / Self Care | Payer: 59 | Source: Ambulatory Visit | Attending: Gynecology | Admitting: Gynecology

## 2017-12-16 DIAGNOSIS — Z1231 Encounter for screening mammogram for malignant neoplasm of breast: Secondary | ICD-10-CM

## 2018-01-05 ENCOUNTER — Ambulatory Visit (INDEPENDENT_AMBULATORY_CARE_PROVIDER_SITE_OTHER): Payer: BLUE CROSS/BLUE SHIELD | Admitting: Gynecology

## 2018-01-05 ENCOUNTER — Encounter: Payer: Self-pay | Admitting: Gynecology

## 2018-01-05 VITALS — BP 118/76 | Ht 68.0 in | Wt 181.0 lb

## 2018-01-05 DIAGNOSIS — N952 Postmenopausal atrophic vaginitis: Secondary | ICD-10-CM | POA: Diagnosis not present

## 2018-01-05 DIAGNOSIS — N950 Postmenopausal bleeding: Secondary | ICD-10-CM

## 2018-01-05 DIAGNOSIS — Z01411 Encounter for gynecological examination (general) (routine) with abnormal findings: Secondary | ICD-10-CM

## 2018-01-05 DIAGNOSIS — N764 Abscess of vulva: Secondary | ICD-10-CM | POA: Diagnosis not present

## 2018-01-05 MED ORDER — DOXYCYCLINE HYCLATE 100 MG PO CAPS: 100.0000 mg | ORAL_CAPSULE | Freq: Two times a day (BID) | ORAL | 2 refills | Status: DC

## 2018-01-05 NOTE — Addendum Note (Signed)
Addended by: Nelva Nay on: 01/05/2018 11:36 AM   Modules accepted: Orders

## 2018-01-05 NOTE — Patient Instructions (Signed)
Take the doxycycline antibiotics twice daily as needed for the vulvar boils.  Follow-up for the ultrasound as scheduled.

## 2018-01-05 NOTE — Progress Notes (Signed)
    Nicole Boyd October 25, 1961 [id number]        56 y.o.  G0P0 for annual gynecologic exam.  Patient notes in December for several days she had some vaginal spotting and then had a heavy flow for a day.  Had some bloating and cramping associated with this.  Otherwise has not had any bleeding for over a year.  No significant hot flushes or sweats.  Past medical history,surgical history, problem list, medications, allergies, family history and social history were all reviewed and documented as reviewed in the EPIC chart.  ROS:  Performed with pertinent positives and negatives included in the history, assessment and plan.   Additional significant findings : None   Exam: [name] assistant Vitals:   01/05/18 0944  BP: 118/76  Weight: 181 lb (82.1 kg)  Height: 5\' 8"  (1.727 m)   Body mass index is 27.52 kg/m.  General appearance:  Normal affect, orientation and appearance. Skin: Grossly normal HEENT: Without gross lesions.  No cervical or supraclavicular adenopathy. Thyroid normal.  Lungs:  Clear without wheezing, rales or rhonchi Cardiac: RR, without RMG Abdominal:  Soft, nontender, without masses, guarding, rebound, organomegaly or hernia Breasts:  Examined lying and sitting without masses, retractions, discharge or axillary adenopathy. Pelvic:  Ext, BUS, Vagina: With mild atrophic changes.  Several small vulvar/perianal boils noted.  Cervix: With atrophic changes.  Pap smear done  Uterus: Anteverted, normal size, shape and contour, midline and mobile nontender   Adnexa: Without masses or tenderness    Anus and perineum: Normal   Rectovaginal: Normal sphincter tone without palpated masses or tenderness.    Assessment/Plan:  57 y.o. G0P0 female for annual gynecologic exam.   1. Postmenopausal bleeding.  Over a year without bleeding and then episode of bleeding in December.  I reviewed the differential with the patient to include possible ovulation, atrophic changes, structural  abnormality such as polyps or submucous myomas and hyperplastic changes up to and including cancer.  Recommended patient proceed with sonohysterogram for endometrial assessment and she agrees with this.  I did a Pap smear also today. 2. Vulvar/perianal boils.  Patient notes throughout the year she will have several episodes of vulvar/perianal boils that come out of nowhere and last for several weeks and then resolves.  Options for management reviewed to include heat and warm soaks as well as possible antibiotic suppression.  We will go ahead with doxycycline 100 mg twice daily times 7 days now as her current episode seems to be resolving historically.  Refills for 2 weeks supply x2 provided to take if she has recurrences to help suppress outbreaks. 3. Pap smear/HPV 2014.  Pap smear was done today secondary to her bleeding history.  No history of abnormal Pap smears previously. 4. Mammography 11/2017.  Continue with annual mammography when due.  Breast exam normal today. 5. Colonoscopy 2015.  Repeat at their recommended interval. 6. DEXA never.  Will plan further into the menopause. 7. Health maintenance.  No routine lab work done as patient reports this done elsewhere.  Follow-up for sonohysterogram.  Follow-up in 1 year for annual exam.  Additional time in excess of her annual routine gynecologic exam was spent in direct face to face counseling and coordination of care in regards to her postmenopausal bleeding and vulvar boils.    [name] MD, 10:35 AM 01/05/2018

## 2018-01-07 LAB — PAP IG W/ RFLX HPV ASCU

## 2018-01-20 ENCOUNTER — Other Ambulatory Visit: Payer: Self-pay | Admitting: Gynecology

## 2018-01-20 DIAGNOSIS — N950 Postmenopausal bleeding: Secondary | ICD-10-CM

## 2018-02-01 DIAGNOSIS — F4320 Adjustment disorder, unspecified: Secondary | ICD-10-CM | POA: Diagnosis not present

## 2018-02-03 ENCOUNTER — Encounter: Payer: Self-pay | Admitting: Gynecology

## 2018-02-03 ENCOUNTER — Ambulatory Visit: Payer: BLUE CROSS/BLUE SHIELD | Admitting: Gynecology

## 2018-02-03 ENCOUNTER — Ambulatory Visit (INDEPENDENT_AMBULATORY_CARE_PROVIDER_SITE_OTHER): Payer: BLUE CROSS/BLUE SHIELD

## 2018-02-03 VITALS — BP 120/74

## 2018-02-03 DIAGNOSIS — N950 Postmenopausal bleeding: Secondary | ICD-10-CM

## 2018-02-03 DIAGNOSIS — D229 Melanocytic nevi, unspecified: Secondary | ICD-10-CM

## 2018-02-03 DIAGNOSIS — N719 Inflammatory disease of uterus, unspecified: Secondary | ICD-10-CM | POA: Diagnosis not present

## 2018-02-03 NOTE — Patient Instructions (Addendum)
Office will call you with biopsy results  Make an appointment to have the mole removed.

## 2018-02-03 NOTE — Progress Notes (Signed)
    Nicole Boyd 02-15-1961 [id number]        56 y.o.  G0P0 presents for sonohysterogram with history of bleeding in December.  Otherwise did well with no bleeding since.  Had associated bloating and cramping to suggest possible ovulatory change.  Past medical history,surgical history, problem list, medications, allergies, family history and social history were all reviewed and documented in the EPIC chart.  Directed ROS with pertinent positives and negatives documented in the history of present illness/assessment and plan.  Exam: [place] assistant General appearance:  Normal Abdomen soft nontender without masses guarding rebound Pelvic external BUS vagina with atrophic changes.  Crusty mole mid upper mons pubis.  Cervix with atrophic changes.  Uterus normal size midline mobile tenderness.  Ultrasound transvaginal shows uterus normal size and echotexture.  2 small myomas measured at 15 mm and 11 mm.  Endometrial echo 4.5 mm.  Right and left ovaries normal.  Cul-de-sac negative.  Sonohysterogram performed, sterile technique, easy catheter introduction, good distention with no abnormality seen.  Slight area of endometrial tufting due to catheter placement noted but no true polyps or other pathology.  Endometrial sample taken.  Patient tolerated well.  Assessment/Plan:  57 y.o. G0P0 with episode of postmenopausal bleeding.  Ultrasound is negative.  Will follow-up for biopsy results.  Assuming negative then plan expectant management with reporting of any further bleeding.  Patient also has a crusty mole upper mid mons pubis that she asked about.  My recommendations were to have this excised and she is going to schedule an appointment for this.  Differential to include seborrheic keratoses versus squamous/basal carcinoma discussed.    [name] MD, 3:56 PM 02/03/2018

## 2018-02-18 ENCOUNTER — Ambulatory Visit: Payer: BLUE CROSS/BLUE SHIELD | Admitting: Gynecology

## 2018-02-18 ENCOUNTER — Encounter: Payer: Self-pay | Admitting: Gynecology

## 2018-02-18 VITALS — BP 118/74

## 2018-02-18 DIAGNOSIS — L820 Inflamed seborrheic keratosis: Secondary | ICD-10-CM | POA: Diagnosis not present

## 2018-02-18 DIAGNOSIS — L989 Disorder of the skin and subcutaneous tissue, unspecified: Secondary | ICD-10-CM

## 2018-02-18 NOTE — Patient Instructions (Signed)
Office will call you with biopsy results 

## 2018-02-18 NOTE — Progress Notes (Signed)
    Nicole Boyd Noa Nov 11, 1961 [id number]        57 y.o.  G0P0 presents for excision of her mons pubis skin mole which is getting larger.  Past medical history,surgical history, problem list, medications, allergies, family history and social history were all reviewed and documented in the EPIC chart.  Directed ROS with pertinent positives and negatives documented in the history of present illness/assessment and plan.  Exam: [name] assistant Vitals:   02/18/18 1045  BP: 118/74   General appearance:  Normal Mons pubis with approximately 7 mm darkly pigmented crusting skin lesion center pubis.  Procedure: The skin overlying and surrounding the skin lesion was cleansed with Betadine and subsequently infiltrated with 1% lidocaine.  The skin lesion was excised in its entirety using an elliptical incision.  The skin was then reapproximated using 4-0 Vicryl interrupted cutaneous stitches.  Hemostasis was achieved.  The specimen was sent to pathology.  The patient was given postoperative instructions.  Assessment/Plan:  57 y.o. G0P0 enlarging pigmented skin lesion excised to rule out atypia.  The patient did well and will follow-up for pathology results.    [name] MD, 11:01 AM 02/18/2018

## 2018-02-22 LAB — PATHOLOGY

## 2018-02-22 LAB — TISSUE SPECIMEN

## 2018-03-31 DIAGNOSIS — F4310 Post-traumatic stress disorder, unspecified: Secondary | ICD-10-CM | POA: Diagnosis not present

## 2018-03-31 DIAGNOSIS — F419 Anxiety disorder, unspecified: Secondary | ICD-10-CM | POA: Diagnosis not present

## 2018-05-05 DIAGNOSIS — R5383 Other fatigue: Secondary | ICD-10-CM | POA: Diagnosis not present

## 2018-05-05 DIAGNOSIS — Z0000 Encounter for general adult medical examination without abnormal findings: Secondary | ICD-10-CM | POA: Diagnosis not present

## 2018-05-12 DIAGNOSIS — D6489 Other specified anemias: Secondary | ICD-10-CM | POA: Diagnosis not present

## 2018-05-12 DIAGNOSIS — E7849 Other hyperlipidemia: Secondary | ICD-10-CM | POA: Diagnosis not present

## 2018-05-12 DIAGNOSIS — Z1389 Encounter for screening for other disorder: Secondary | ICD-10-CM | POA: Diagnosis not present

## 2018-05-12 DIAGNOSIS — F908 Attention-deficit hyperactivity disorder, other type: Secondary | ICD-10-CM | POA: Diagnosis not present

## 2018-05-12 DIAGNOSIS — Z0000 Encounter for general adult medical examination without abnormal findings: Secondary | ICD-10-CM | POA: Diagnosis not present

## 2018-05-12 DIAGNOSIS — M2550 Pain in unspecified joint: Secondary | ICD-10-CM | POA: Diagnosis not present

## 2018-05-13 DIAGNOSIS — Z1212 Encounter for screening for malignant neoplasm of rectum: Secondary | ICD-10-CM | POA: Diagnosis not present

## 2019-01-13 ENCOUNTER — Encounter: Payer: BLUE CROSS/BLUE SHIELD | Admitting: Gynecology

## 2019-01-16 ENCOUNTER — Encounter: Payer: BLUE CROSS/BLUE SHIELD | Admitting: Gynecology

## 2019-01-24 ENCOUNTER — Encounter: Payer: Self-pay | Admitting: Gynecology

## 2019-01-24 ENCOUNTER — Ambulatory Visit: Payer: BC Managed Care – PPO | Admitting: Gynecology

## 2019-01-24 VITALS — BP 118/76 | Ht 67.5 in | Wt 183.0 lb

## 2019-01-24 DIAGNOSIS — N952 Postmenopausal atrophic vaginitis: Secondary | ICD-10-CM

## 2019-01-24 DIAGNOSIS — Z01419 Encounter for gynecological examination (general) (routine) without abnormal findings: Secondary | ICD-10-CM

## 2019-01-24 NOTE — Patient Instructions (Signed)
Follow-up in 1 year for annual exam, sooner if any issues. 

## 2019-01-24 NOTE — Progress Notes (Signed)
    Nicole Boyd 1961-12-02 [id number]        58 y.o.  G0P0 for annual gynecologic exam.  Exam hot flushes and sweats.  Past medical history,surgical history, problem list, medications, allergies, family history and social history were all reviewed and documented as reviewed in the EPIC chart.  ROS:  Performed with pertinent positives and negatives included in the history, assessment and plan.   Additional significant findings : None   Exam: Nicole Boyd assistant Vitals:   01/24/19 1518  BP: 118/76  Weight: 183 lb (83 kg)  Height: 5' 7.5" (1.715 m)   Body mass index is 28.24 kg/m.  General appearance:  Normal affect, orientation and appearance. Skin: Grossly normal HEENT: Without gross lesions.  No cervical or supraclavicular adenopathy. Thyroid normal.  Lungs:  Clear without wheezing, rales or rhonchi Cardiac: RR, without RMG Abdominal:  Soft, nontender, without masses, guarding, rebound, organomegaly or hernia Breasts:  Examined lying and sitting without masses, retractions, discharge or axillary adenopathy. Pelvic:  Ext, BUS, Vagina: Normal with atrophic changes  Cervix: Normal with atrophic changes  Uterus: Anteverted, normal size, shape and contour, midline and mobile nontender   Adnexa: Without masses or tenderness    Anus and perineum: Normal   Rectovaginal: Normal sphincter tone without palpated masses or tenderness.    Assessment/Plan:  58 y.o. G0P0 female for annual gynecologic exam.  1. Postmenopausal.  Having some hot flushes and sweats that are acceptable to the patient.  Discussed options to include OTC products and she is not interested at this time.  History of postmenopausal bleeding last year with evaluation negative.  No bleeding since. 2. Mammography due now and I reminded her to schedule this.  Breast exam normal today. 3. Pap smear 2019.  No Pap smear done today.  No history of abnormal Pap smears.  Plan repeat Pap smear at 3-year interval per  current screening guidelines. 4. Colonoscopy 2015.  Repeat at their recommended interval. 5. DEXA never.  Will plan further into the menopause. 6. Health maintenance.  No routine lab work done as patient does this elsewhere.  Follow-up 1 year, sooner as needed.   [name] MD, 4:01 PM 01/24/2019

## 2019-08-29 DIAGNOSIS — F909 Attention-deficit hyperactivity disorder, unspecified type: Secondary | ICD-10-CM | POA: Diagnosis not present

## 2019-08-29 DIAGNOSIS — R002 Palpitations: Secondary | ICD-10-CM | POA: Diagnosis not present

## 2019-09-19 ENCOUNTER — Encounter: Payer: Self-pay | Admitting: Gynecology

## 2019-11-09 DIAGNOSIS — E785 Hyperlipidemia, unspecified: Secondary | ICD-10-CM | POA: Diagnosis not present

## 2019-11-09 DIAGNOSIS — D649 Anemia, unspecified: Secondary | ICD-10-CM | POA: Diagnosis not present

## 2019-11-09 DIAGNOSIS — R002 Palpitations: Secondary | ICD-10-CM | POA: Diagnosis not present

## 2019-11-09 DIAGNOSIS — F909 Attention-deficit hyperactivity disorder, unspecified type: Secondary | ICD-10-CM | POA: Diagnosis not present

## 2020-02-01 ENCOUNTER — Other Ambulatory Visit: Payer: Self-pay

## 2020-02-02 ENCOUNTER — Ambulatory Visit (INDEPENDENT_AMBULATORY_CARE_PROVIDER_SITE_OTHER): Payer: 59 | Admitting: Obstetrics and Gynecology

## 2020-02-02 ENCOUNTER — Encounter: Payer: Self-pay | Admitting: Obstetrics and Gynecology

## 2020-02-02 VITALS — BP 118/76 | Ht 67.5 in | Wt 183.0 lb

## 2020-02-02 DIAGNOSIS — Z01419 Encounter for gynecological examination (general) (routine) without abnormal findings: Secondary | ICD-10-CM

## 2020-02-02 NOTE — Patient Instructions (Signed)
Please call to schedule your yearly mammogram Next pap smear will be in 2022

## 2020-02-02 NOTE — Progress Notes (Signed)
   Nicole Boyd [birth year] [id number]  SUBJECTIVE:  59 y.o. G0P0 female for annual routine gynecologic exam and Pap smear. She has no gynecologic concerns.  Current Outpatient Medications  Medication Sig Dispense Refill  . methylphenidate (RITALIN) 10 MG tablet Take 10 mg by mouth daily.     No current facility-administered medications for this visit.   Allergies: Patient has no known allergies.  Patient's last menstrual period was 08/14/2013.  Past medical history,surgical history, problem list, medications, allergies, family history and social history were all reviewed and documented as reviewed in the EPIC chart.  ROS:  Feeling well. No dyspnea or chest pain on exertion.  No abdominal pain, change in bowel habits, black or bloody stools.  No urinary tract symptoms. GYN ROS: no abnormal bleeding, pelvic pain or discharge, no breast pain or new or enlarging lumps on self exam. No neurological complaints.   OBJECTIVE:  BP 118/76   Ht 5' 7.5" (1.715 m)   Wt 183 lb (83 kg)   LMP 08/14/2013   BMI 28.24 kg/m  The patient appears well, alert, oriented x 3, in no distress. ENT normal.  Neck supple. No cervical or supraclavicular adenopathy or thyromegaly.  Lungs are clear, good air entry, no wheezes, rhonchi or rales. S1 and S2 normal, no murmurs, regular rate and rhythm.  Abdomen soft without tenderness, guarding, mass or organomegaly.  Neurological is normal, no focal findings.  BREAST EXAM: breasts appear normal, no suspicious masses, no skin or nipple changes or axillary nodes  PELVIC EXAM: VULVA: normal appearing vulva with no masses, tenderness or lesions, mild atrophic changes, VAGINA: normal appearing vagina with normal color and discharge, no lesions, CERVIX: normal appearing cervix without discharge or lesions, UTERUS: uterus is normal size, shape, consistency and nontender, ADNEXA: normal adnexa in size, nontender and no masses, RECTAL: normal rectal, no masses  Chaperone:  [name] present during the examination  ASSESSMENT:  59 y.o. G0P0 here for annual gynecologic exam  PLAN:   1. Postmenopause.  Mild vasomotor symptoms have improved somewhat, not requiring intervention at this time.  No vaginal bleeding or other concerns. 2. Pap smear 12/2017. Not repeated today. No prior history of abnormal Pap smears. Next Pap smear due 2022 following the current screening guidelines calling for the 3-year interval. 3. Mammogram 11/2017. Overdue for repeat annual imaging, plans to call to schedule.  Breast exam normal today. 4. Colonoscopy 2015. Recommended that she continue per the prescribed interval.  Recommended that she pursue getting this done this year, and she indicates she has contact information to facilitate this. 5. DEXA indicated later into menopause. 6. Health maintenance.  No lab work as she has this completed with her primary care provider.   Return annually or sooner, prn.  Joseph Pierini MD  02/02/20

## 2021-01-22 ENCOUNTER — Ambulatory Visit (INDEPENDENT_AMBULATORY_CARE_PROVIDER_SITE_OTHER): Payer: 59 | Admitting: Clinical

## 2021-01-22 DIAGNOSIS — F840 Autistic disorder: Secondary | ICD-10-CM | POA: Diagnosis not present

## 2021-02-07 ENCOUNTER — Ambulatory Visit (INDEPENDENT_AMBULATORY_CARE_PROVIDER_SITE_OTHER): Payer: 59 | Admitting: Obstetrics and Gynecology

## 2021-02-07 ENCOUNTER — Other Ambulatory Visit: Payer: Self-pay

## 2021-02-07 ENCOUNTER — Encounter: Payer: Self-pay | Admitting: Obstetrics and Gynecology

## 2021-02-07 VITALS — BP 118/68 | HR 68 | Ht 67.0 in | Wt 178.0 lb

## 2021-02-07 DIAGNOSIS — Z01419 Encounter for gynecological examination (general) (routine) without abnormal findings: Secondary | ICD-10-CM

## 2021-02-07 DIAGNOSIS — Z124 Encounter for screening for malignant neoplasm of cervix: Secondary | ICD-10-CM

## 2021-02-07 NOTE — Progress Notes (Signed)
   Michaila Kenney Blanda 10/22/1961 [id number]  SUBJECTIVE:  60 y.o. G0P0 female for annual routine gynecologic exam and Pap smear. She has no gynecologic concerns.  Current Outpatient Medications  Medication Sig Dispense Refill  . methylphenidate (RITALIN) 10 MG tablet Take 10 mg by mouth daily.     No current facility-administered medications for this visit.   Allergies: Patient has no known allergies.  Patient's last menstrual period was 08/14/2013.  Past medical history,surgical history, problem list, medications, allergies, family history and social history were all reviewed and documented as reviewed in the EPIC chart.  ROS: Pertinent positives and negatives as reviewed in HPI   OBJECTIVE:  BP 118/68 (BP Location: Right Arm, Patient Position: Sitting, Cuff Size: Normal)   Pulse 68   Ht 5\' 7"  (1.702 m)   Wt 178 lb (80.7 kg)   LMP 08/14/2013   BMI 27.88 kg/m  The patient appears well, alert, oriented x 3, in no distress. ENT normal.  Neck supple. No cervical or supraclavicular adenopathy or thyromegaly.  Lungs are clear, good air entry, no wheezes, rhonchi or rales. S1 and S2 normal, no murmurs, regular rate and rhythm.  Abdomen soft without tenderness, guarding, mass or organomegaly.  Neurological is normal, no focal findings.  BREAST EXAM: breasts appear normal, no suspicious masses, no skin or nipple changes or axillary nodes  PELVIC EXAM: VULVA: normal appearing vulva with no masses, tenderness or lesions, mild atrophic changes, VAGINA: normal appearing vagina with normal color and discharge, no lesions, CERVIX: normal appearing cervix without discharge or lesions, UTERUS: uterus is normal size, shape, consistency and nontender, ADNEXA: normal adnexa in size, nontender and no masses  Chaperone: Terence Lux present during the examination  ASSESSMENT:  60 y.o. G0P0 here for annual gynecologic exam  PLAN:   1. Postmenopause.  No significant hot flashes or night sweats.   No vaginal bleeding or other concerns. 2. Pap smear 12/2017.  Pap smear is repeated today. No prior history of abnormal Pap smears.  3. Mammogram 11/2017. Overdue for repeat annual imaging, plans to call to schedule.  Breast exam normal today. 4. Colonoscopy 2021 with polyps, requested follow-up reported in 5-year interval. 5. DEXA indicated when later into menopause.  Discussed calcium and vitamin D recommendations, weightbearing exercise. 6. Health maintenance.  No lab work as she has this completed elsewhere. The patient is aware that I will only be at this practice until early March 2022 so she knows to make sure she requests any needed follow-up with one of my partners when I am no longer at the practice.    Return annually or sooner, prn.   Joseph Pierini MD  02/07/21

## 2021-02-10 LAB — PAP IG W/ RFLX HPV ASCU

## 2021-04-04 ENCOUNTER — Ambulatory Visit: Payer: Self-pay | Attending: Internal Medicine

## 2021-04-04 ENCOUNTER — Other Ambulatory Visit: Payer: Self-pay

## 2021-04-04 DIAGNOSIS — Z23 Encounter for immunization: Secondary | ICD-10-CM

## 2021-04-04 NOTE — Progress Notes (Signed)
   Covid-19 Vaccination Clinic  Name:  Nicole Boyd    MRN: [id number] DOB: [birth year]  04/04/2021  Nicole Boyd was observed post Covid-19 immunization for 15 minutes without incident. She was provided with Vaccine Information Sheet and instruction to access the V-Safe system.   Nicole Boyd was instructed to call 911 with any severe reactions post vaccine: Marland Kitchen Difficulty breathing  . Swelling of face and throat  . A fast heartbeat  . A bad rash all over body  . Dizziness and weakness   Immunizations Administered    Name Date Dose VIS Date Route   PFIZER Comrnaty(Gray TOP) Covid-19 Vaccine 04/04/2021  2:10 PM 0.3 mL 11/28/2020 Intramuscular   Manufacturer: [employer]   Lot: [id number]   NDC: [phone]

## 2021-04-07 ENCOUNTER — Ambulatory Visit: Payer: Self-pay

## 2021-04-07 ENCOUNTER — Other Ambulatory Visit (HOSPITAL_BASED_OUTPATIENT_CLINIC_OR_DEPARTMENT_OTHER): Payer: Self-pay

## 2021-04-07 MED ORDER — COVID-19 MRNA VACCINE (PFIZER) 30 MCG/0.3ML IM SUSP
INTRAMUSCULAR | 0 refills | Status: DC
  Filled 2021-04-07: qty 0.3, 1d supply, fill #0

## 2021-06-04 ENCOUNTER — Other Ambulatory Visit: Payer: Self-pay | Admitting: Internal Medicine

## 2021-06-04 DIAGNOSIS — Z1231 Encounter for screening mammogram for malignant neoplasm of breast: Secondary | ICD-10-CM

## 2021-07-30 ENCOUNTER — Other Ambulatory Visit: Payer: Self-pay

## 2021-07-30 ENCOUNTER — Ambulatory Visit
Admission: RE | Admit: 2021-07-30 | Discharge: 2021-07-30 | Disposition: A | Discharge status: Home / Self Care | Payer: 59 | Source: Ambulatory Visit | Attending: Internal Medicine | Admitting: Internal Medicine

## 2021-07-30 DIAGNOSIS — Z1231 Encounter for screening mammogram for malignant neoplasm of breast: Secondary | ICD-10-CM

## 2022-02-10 ENCOUNTER — Ambulatory Visit: Payer: Self-pay | Admitting: Radiology

## 2022-02-10 NOTE — Progress Notes (Signed)
° °  Nicole Boyd 12/21/1961 [id number]   History: Postmenopausal 61 y.o. presents for annual exam.   Gynecologic History Postmenopausal Last Pap: 2019. Results were: normal Last mammogram: 2022. Results were: normal Last colonoscopy: 2021, polyps Dexa: never HRT use: none  Obstetric History OB History  Gravida Para Term Preterm AB Living  0 0          SAB IAB Ectopic Multiple Live Births                The following portions of the patient's history were reviewed and updated as appropriate: allergies, current medications, past family history, past medical history, past social history, past surgical history, and problem list.  Review of Systems Pertinent items noted in HPI and remainder of comprehensive ROS otherwise negative.  Past medical history, past surgical history, family history and social history were all reviewed and documented in the EPIC chart.  Exam:  Vitals:   02/11/22 0957  BP: 110/74  Weight: 166 lb (75.3 kg)  Height: 5\' 7"  (1.702 m)   Body mass index is 26 kg/m.  General appearance:  Normal Thyroid:  Symmetrical, normal in size, without palpable masses or nodularity. Respiratory  Auscultation:  Clear without wheezing or rhonchi Cardiovascular  Auscultation:  Regular rate, without rubs, murmurs or gallops  Edema/varicosities:  Not grossly evident Abdominal  Soft,nontender, without masses, guarding or rebound.  Liver/spleen:  No organomegaly noted  Hernia:  None appreciated  Skin  Inspection:  Grossly normal Breasts: Examined lying and sitting.   Right: Without masses, retractions, nipple discharge or axillary adenopathy.   Left: Without masses, retractions, nipple discharge or axillary adenopathy. Genitourinary   Inguinal/mons:  Normal without inguinal adenopathy  External genitalia:  Normal appearing vulva with no masses, tenderness, or lesions  BUS/Urethra/Skene's glands:  Normal  Vagina:  Normal appearing with normal color and discharge,  no lesions. Atrophy: mild   Cervix:  Normal appearing without discharge or lesions. Pap obtained  Uterus:  Normal in size, shape and contour.  Midline and mobile, nontender  Adnexa/parametria:     Rt: Normal in size, without masses or tenderness.   Lt: Normal in size, without masses or tenderness.  Anus and perineum: Normal    Patient informed chaperone available to be present for breast and pelvic exam. Patient has requested no chaperone to be present. Patient has been advised what will be completed during breast and pelvic exam.   Assessment/Plan:   - Well woman exam - Pap with cotesting -Mammo up to date - DEXA ordered.  -Colonoscopy 2026 - Dermatology skin check recommended   Discussed SBE, colonoscopy and DEXA screening as directed. Recommend 167mins of exercise weekly, including weight bearing exercise. Encouraged the use of seatbelts and sunscreen.  Return in 1 year for annual or sooner prn.  Kerry Dory WHNP-BC, 10:39 AM 02/11/2022

## 2022-02-11 ENCOUNTER — Other Ambulatory Visit (HOSPITAL_COMMUNITY)
Admission: RE | Admit: 2022-02-11 | Discharge: 2022-02-11 | Disposition: A | Discharge status: Home / Self Care | Payer: 59 | Source: Ambulatory Visit | Attending: Radiology | Admitting: Radiology

## 2022-02-11 ENCOUNTER — Encounter: Payer: Self-pay | Admitting: Radiology

## 2022-02-11 ENCOUNTER — Other Ambulatory Visit: Payer: Self-pay

## 2022-02-11 ENCOUNTER — Ambulatory Visit (INDEPENDENT_AMBULATORY_CARE_PROVIDER_SITE_OTHER): Payer: 59 | Admitting: Radiology

## 2022-02-11 VITALS — BP 110/74 | Ht 67.0 in | Wt 166.0 lb

## 2022-02-11 DIAGNOSIS — Z01419 Encounter for gynecological examination (general) (routine) without abnormal findings: Secondary | ICD-10-CM | POA: Diagnosis present

## 2022-02-11 DIAGNOSIS — Z13820 Encounter for screening for osteoporosis: Secondary | ICD-10-CM | POA: Diagnosis not present

## 2022-02-16 LAB — CYTOLOGY - PAP
Comment: NEGATIVE
Diagnosis: NEGATIVE
High risk HPV: NEGATIVE

## 2022-03-03 ENCOUNTER — Other Ambulatory Visit: Payer: Self-pay

## 2022-03-03 ENCOUNTER — Ambulatory Visit (INDEPENDENT_AMBULATORY_CARE_PROVIDER_SITE_OTHER): Payer: 59 | Admitting: Clinical

## 2022-03-03 DIAGNOSIS — F4322 Adjustment disorder with anxiety: Secondary | ICD-10-CM | POA: Diagnosis not present

## 2022-03-03 NOTE — Progress Notes (Signed)
[facility] Counselor Initial Adult Exam ? ?Name: Henya Gleed ?Date: 03/03/2022 ?MRN: [id number] ?DOB: [birth year] ?PCP: Prince Solian, MD ? ?Time spent: 1:00 pm-2:00 pm (60 minutes) ? ?Guardian/PayeeBarbaraann Share Stamos    ? ?Paperwork requested: Yes  ? ?Reason for Visit /Presenting Problem: Yeva is experiencing stress and anxiety due to some challenging home situations.  ? ?Mental Status Exam: ?Appearance:   Meticulous     ?Behavior:  Appropriate  ?Motor:  Normal  ?Speech/Language:   Clear and Coherent  ?Affect:  Appropriate  ?Mood:  normal  ?Thought process:  normal  ?Thought content:    WNL  ?Sensory/Perceptual disturbances:    WNL  ?Orientation:  oriented to person, place, time/date, and situation  ?Attention:  Good  ?Concentration:  Good  ?Memory:  WNL  ?Fund of knowledge:   Good  ?Insight:    Good  ?Judgment:   Good  ?Impulse Control:  Good  ? ? ?Reported Symptoms and Background:  Anaise had a prior positive experienced in therapy when she was in her 70s and sought counseling for help with feelings of insecurity.  ? ?Currently Arleta is experiencing stress related to her 14 year old son with ASD (high functioning). He is adopted. He has experienced stress and trauma from school, had a negative reaction to medication over the holidays and is "keyed up all the time ." He has his own therapist, but she needs help managing the stress associated with this.  ? ?Her husband is also at his "breaking point" and is frustrated all the time. Lorielle reported that she is very spiritual which has been helping her but in the last week she has experienced more physically impact of stress including not sleeping and stomach challenges.  ? ?She has a prior diagnosis of AD/HD for which she takes medication.  ? ?Mood ?Sargun reported that her current mood is worried and she is unsure of what to do. Her way of dealing with things is usually to ignore it and pretend it is not happening (e.g., play games on her phone).  Her husband's way of dealing with things is to "bully through" until he gets what he wants. Arieanna's husband and son can have some conflict. She can escape from the stressful home situation by going to the office, but her spouse cannot, and Pari feels some guilt about that.  ? ?Shammara reported feeling guilt about potentially giving him too much support when he was younger and now is unsure about how much support to give him.  ? ?Symptoms of Depression  ?Sadness, crying, down mood: some but not an extreme amount Litisha reported that she is not depressed, but has been more awake and had a hard time sleeping.  ? ?PHQ-9 score: 4 (normal range) ? ?Risk Assessment: ?Danger to Self:  No ?Self-injurious Behavior: No ?Danger to Others: No ?Duty to Warn:no ?Physical Aggression / Violence:No  ?Access to Firearms a concern: No  ?Gang Involvement:No  ? ?While future psychiatric events cannot be accurately predicted, the patient does not currently require acute inpatient psychiatric care and does not currently meet [facility] involuntary commitment criteria. ? ?GAD: ?Excessive worry: no, Elianah reported that her level of worry is reasonable given current situation  ?For example, given her son's struggle and lack of clarity about next steps, she is experiencing anxiety.  ? ?Physical symptoms ?- restlessness or on edge: No ?- easily fatigued: No ?- cant concentrate: Yes  ?- mind goes blank: sometimes  ?- irritability: sometimes   ?-  muscle tension: Yes  ?- sleep disturbance: Yes - more in the last few weeks  ? ?GAD-7 score: 5 (mild) ? ? ?Substance Abuse History: ?Current substance abuse:  glass of wine occasionally - not very often     ? ?Past Psychiatric History:   ?No previous psychological problems have been observed ?Outpatient Providers: in 38s had a good experience with her therapist. The most helpful activities were when after the 1st session her therapist wanted Rozell to write her entire life story and read it to  her, she was asked to write 10 compliments about herself, and was very helped by "putting family members in the chair" especially when her mother was in that position.  ? ?History of Psych Hospitalization: No  ?Psychological Testing: AD/HD testing - 5 years ago - went to [employer] [facility] [employer]  ? ?Trauma: No - denied trauma  ? ?Parents were alcoholics and Aminta's mother went to "dry out" when Beverlyn was in the 3rd grade  ? ?Abuse History: denied  ?Witness / Exposure to Domestic Violence: No   ?Protective Services Involvement: No  ?Witness to [employer] Violence:   none reported  ? ?Family History form Chart:  ?Family History  ?Problem Relation Age of Onset  ? Hypertension Mother   ? Heart disease Mother   ? Lung disease Mother   ? Hypertension Father   ? Heart disease Father   ? Ovarian cancer Paternal Aunt   ? ? ?Living situation: the patient lives with their family (spouse and child)  ? ?Sexual Orientation: Straight ? ?Relationship Status: married  ?Name of spouse / other:Jonni Sanger - ?Makailyn has been with her husband since 2002. Their marriage is stressful currently because of their son but they are "perfectly happy" with each other otherwise.  ?If a parent, number of children / ages: son - Kara Mead - 53 years old ; overall Falesha reported that she is a safe person for Cotes and their relationship is loving. She described him as a sweet person underneath but all the stuff piling on has made him less sweet. ?Son knows that he is adopted and in the last year he has been bring up his biological family more but Odis reported that this does not make her upset and she does not have feelings hurt easily.  ? ?Support Systems: spouse, Mother in Sports coach, Coworkers, Friends but more in [place] than here (they moved here in 2005)   ? ?Financial Stress:  Yes - Khamia's husband is "brilliant" but is not currently employed, partially because he was helping their son with virtual school. This has been hard. Dilan reported that she works for a  non-profit so their income is not huge. They do have a portion of a farm in [place] that was Andy's family's.   ? ?Military Service: No  ? ?Educational History: ?Education: post Forensic psychologist work or degree ? ?Religion/Partiality/World View: in 8th grade Cintia was in a [place] youth group and since then has had a strong faith. In the last 2 years se has come back to it. ? ?One of the people that she works with is a medium and it is cool. Tayte gest a tingly feeling like it is someone from other side is paying attention to her at times.  ? ?Any cultural differences that may affect / interfere with treatment:  not applicable  ? ?Recreation/Hobbies: favorite thing to do is to be around people. [place] is a good judge of character but "does not suffer fools very easily".  She would love to have people over but it is too expensive and messy and Jonni Sanger does not like many people. She has not had the social life she would like to have. Other hobbies include reading (but she cannot concentrate enough to do that), movies, puzzles  ? ?Stressors: Makynzie's son and everything he is going through is a stressor. Before he was diagnosed on the spectrum he had a hard 3rd grade year and an incident that impacted him. In the 4th grade he could not function and did homebound instruction and was "so triggered all time". He went to a different school for 5th and 6th grade but was still struggling.  Then COVID happened and he did virtual school and that was challenging (e.g., he could be bothered by someone on the screen with him). Since COVID he has been afraid of going to school (e.g., worried about school shootings). He can be overly focused on people that are doing something they are not supposed to do. Most recently he had a violent episode after a medication change and they had to call the police. At the same time the school became concerned that about his behavior related to a comment he made about Merlene Pulling and  took him out of class, searched his bag, etc. Around that time other negative incidents at school (involving other students not Jackson) occurred and he was "completely unglued" about the, His parents hav

## 2022-03-03 NOTE — Progress Notes (Signed)
? ? ? ? ? ? ? ? ? ? ? ? ? ? ?  Eileen Leuthe, [name] ?

## 2022-03-17 ENCOUNTER — Ambulatory Visit (INDEPENDENT_AMBULATORY_CARE_PROVIDER_SITE_OTHER): Payer: 59 | Admitting: Clinical

## 2022-03-17 ENCOUNTER — Other Ambulatory Visit: Payer: Self-pay

## 2022-03-17 DIAGNOSIS — F4322 Adjustment disorder with anxiety: Secondary | ICD-10-CM

## 2022-03-17 NOTE — Progress Notes (Signed)
Individual Treatment Plan  ? ?Name: Nicole Boyd  ?MRN: [id number] ? ? ?Plan Developed: 03/03/2022 & 03/17/2022 ?Anticipated end date: 08/2022   ? ?Goals of therapy will be to help manage and/or decrease symptoms associated with [name]'s diagnosis to improve daily functioning. ? ?Who participated in treatment planning:  Therapist, patient ?The following goals were developed in collaboration with the patient.  ? ?Problem/Need: Anxiety / stress - [name] reported a high level of anxiety and stress related to a situation with her son.  ? ?Long-Term Goal #1: Reduce level of general distress and anxiety to a more manageable level as evidenced by patient report ? ?Short-Term Objectives: ?Objective 1A: [name] will be able to identify 5 coping resources/activities within 3 months.  ?Objective 1B: [name] will be able to identify ineffective or negative coping strategies within 3-5 months. ?Objective 1C: [name] will be able to replace ineffective or negative coping strategies with more adaptive strategies within 6 months. ?Interventions: Cognitive Behavioral Therapy, Motivational Interviewing, and Psycho-education/Bibliotherapy  coping skills, and other evidenced-based practices will be used to promote progress towards healthy functioning and to help manage decrease symptoms associated with their diagnosis.  ?Treatment Regimen: Individual bi-weekly skill building sessions for assist with treatment goal/objective ?Target Date: 08/2022 ?Responsible Party: therapist and patient ?Person delivering treatment: Licensed Psychologist [name], [name] will support the patient's ability to achieve the goals identified. ? ?Problem/Need: Communication challenges - [name] reported some negative communication with her family members.  ?Long-Term Goal #2: [name] will develop effective communication strategies to use with her family members.  ?Short-Term Objectives: ?Objective 2A: [name] will be able to identify communication breakdowns  with her family members within 3 months.  ?Objective 2B: [name] will be able to practice different communication strategies in session within 6 months. ?Objective 2C: [name] will be able to will try some of these communication strategies with her family members within 6 months. ?Interventions: Cognitive Behavioral Therapy, Roleplay, and Motivational Interviewing  and other evidenced-based practices will be used to promote progress towards healthy functioning and to help manage decrease symptoms associated with their diagnosis.  ?Treatment Regimen: Individual bi-weekly skill building sessions to work on treatment goal/objective ?Target Date: 08/2022 ?Responsible Party: therapist and patient ?Person delivering treatment: Licensed Psychologist [name], [name] will support the patient's ability to achieve the goals identified. ? ? ?patient participated in treatment planning: ?_X_ contributed to goals and plan ?_X_ aware of plan content ?__ reviewed written plan ?__ refused to participate ?__ unable to participate because _________________________________________ ? ? ?Progress and treatment plan will be reviewed periodically (at least every 180 days, or sooner if needed). This treatment plan was reviewed with [name] and patient signed in agreement.  ? ? ? ?[name], [name] ?

## 2022-03-17 NOTE — Progress Notes (Signed)
[facility] Counselor/Therapist Progress Note ? ?Patient ID: Nicole Boyd, MRN: [id number]   ? ?Date: 03/17/22 ? ?Time Spent: 2:10 pm - 3:08 pm: 58 Minutes ? ?Type of Service Provided Individual Therapy  ?Type of Contact in-person ? ? ?Mental Status Exam: ?Appearance:  Neat and Well Groomed     ?Behavior: Appropriate  ?Motor: Normal  ?Speech/Language:  Clear and Coherent and a bit fast  ?Affect: Appropriate  ?Mood: normal  ?Thought process: normal  ?Thought content:   WNL  ?Sensory/Perceptual disturbances:   WNL  ?Orientation: oriented to person, place, time/date, and situation  ?Attention: Good  ?Concentration: Good  ?Memory: WNL  ?Fund of knowledge:  Good  ?Insight:   Fair  ?Judgment:  Good  ?Impulse Control: Good  ? ?Risk Assessment: no apparent indicators of SI or HI during current visit ? ?Presenting Problems, Reported Symptoms, and /or Interim History: Nicole Boyd had an IEP meeting for her son but continue to be unsure of the path forward for him.  ? ?Subjective: Nicole Boyd presented for an individual outpatient therapy session. The following was addressed during sessions.  ? ?Goals were reviewed and signed by [name]. Nicole Boyd expressed negative thoughts involving the time line of her son's issues being identified and concerns that issues could have potentially been identified earlier, as well as what his diagnoses mean for his life. She reported that she has been feeling a high level of stress / anxiety (which she experiences as a racing heart, tense muscles, and feeling tired). She rated her stress/anxiety level as an 8.5 out of 10 (with 10 being the most stressed she could be). She reported a few activities that she engaged in that lowered her stress level temporarily (yoga and pulling weeds). She was encouraged to think of other activities that she may be able to engage in that would have a similar effect. She has been working to help manage his schooling at home during the mornings. They are  discussing potential residential treatment. She is pursuing multiple avenues to help her child. However this is causing her stress. She also is stressed by the lact of a clear path forward. Nicole Boyd indicated that she tends to avoid things that cause her stress. She was asked to identify one thing that was causing her stress that she could address. However, she selected a somewhat large thing to address and after discussion it appeared that it may be best for her to facilitate her moving it off her to do list and elicit her husband's support in completing. She was asked to identify a small things that she has been avoiding for the next visit.  ? ?Interventions/Psychotherapy Techniques Used During Session: Cognitive Behavioral Therapy and Motivational Interviewing ? ?Diagnosis: Adjustment disorder with anxious mood ? ?MENTAL HEALTH INTERVENTIONS USED DURING TREATMENT & PATIENT'S RESPONSE TO INTERVENTIONS:  ?Short-term Objective addressed today:Nicole Boyd will be able to identify 5 coping resources/activities within 3 months.  ?Mental health techniques used: Objective was addressed in session through the use of  Cognitive Behavioral Therapy and discussion Naylah's response was positive.  ?Progress Toward Goal: progressing - she identified 2 coping activities during session.  ? ?Short-term Objective addressed today:Nicole Boyd will be able to identify ineffective or negative coping strategies within 3-5 months. ?Mental health techniques used: Objective was addressed in session through the use of  CBT and discussion. Nicole Boyd's response was positive.  ?Progress Toward Goal: progressing - she identified one ineffective coping strategy.  ?   ? ?PLAN  ?1. Nicole Boyd will return for a therapy  session.   ?2. Homework Given:  Identify and engage in activities that can reduce her stress level from an 8.5 to an 8. This homework will be reviewed with [name] at the next visit.  ?3. During the next session discuss plan for son, stress level, and  coping strategies.   ? ? ?[name], [name] ? ? ? ?

## 2022-03-31 ENCOUNTER — Ambulatory Visit (INDEPENDENT_AMBULATORY_CARE_PROVIDER_SITE_OTHER): Payer: 59 | Admitting: Clinical

## 2022-03-31 DIAGNOSIS — F4322 Adjustment disorder with anxiety: Secondary | ICD-10-CM

## 2022-03-31 NOTE — Progress Notes (Signed)
[facility] Counselor/Therapist Progress Note ? ?Patient ID: Nicole Boyd, MRN: [id number]   ? ?Date: 03/31/22 ? ?Time Spent: 10:00 am - 11:00 am: 60 Minutes ? ?Type of Service Provided Individual Therapy  ?Type of Contact in-person ?Location: office       ?Provider Location: office ? ?Mental Status Exam: ?Appearance:  Neat and Well Groomed     ?Behavior: Appropriate  ?Motor: Normal  ?Speech/Language:  Clear and Coherent  ?Affect: Appropriate  ?Mood: normal  ?Thought process: normal  ?Thought content:   WNL  ?Sensory/Perceptual disturbances:   WNL  ?Orientation: oriented to person, place, time/date, and situation  ?Attention: Good  ?Concentration: Good  ?Memory: WNL  ?Fund of knowledge:  Good  ?Insight:   Fair  ?Judgment:  Good  ?Impulse Control: Good  ? ?Risk Assessment: No apparent indicators of SI or HI during the current visit ? ?Presenting Problems, Reported Symptoms, and /or Interim History: Nicole Boyd presented for a therapy session due to concerns about stress and anxiety related to her son's situation.  She and her spouse also had a large disagreement between the session and the last one. ? ?Subjective: Nicole Boyd presented for an individual outpatient therapy session. The following was addressed during sessions.  ? ?Nicole Boyd reported that since the prior visit she received paperwork that indicated that her son's IQ score had dropped substantially.  Seeking out information from a developmental behavioral pediatrician was suggested.  During the ongoing IEP meetings the family was presented with 2 potential local options for her son's schooling (one of which was [employer]).  However the meeting caused some conflict between Nicole Boyd and her spouse.  She described feeling that she was able to set an appropriate boundary with him.  They have since talked about things and seem to be doing well.  She is continuing to seek out substantial supports for her child.  She was not able to identify and complete a test  that she had been avoiding.  This was discussed further with Nicole Boyd identifying barriers to completing tasks.  Specifically she reported that other tasks seem more pressing and she addressed those and feels that she may be more able to address the tasks that she was avoiding now that these tasks are complete.  Plans for managing anxiety and stress at home through the end of the school year were discussed with Nicole Boyd.  Specifically the idea of working with her son to come up with a plan for finishing out the school year was discussed.  In addition the opportunity that one of the 2 offered school programs might provide her and her husband some respite was discussed. ? ?Interventions/Psychotherapy Techniques Used During Session: Cognitive Behavioral Therapy and Assertiveness/Communication ? ?Diagnosis: Adjustment disorder with anxious mood ? ?MENTAL HEALTH INTERVENTIONS USED DURING TREATMENT & PATIENT'S RESPONSE TO INTERVENTIONS:  ?Short-term Objective addressed today: Nicole Boyd will be able to identify communication breakdowns with her family members within 3 months.; and Nicole Boyd will be able to practice different communication strategies in session within 6 months. ?Mental health techniques used: Objective was addressed in session through the use of Cognitive Behavioral Therapy and Assertiveness/Communication, and discussion. Lotoya's  response was generally positive -specifically strategies related to how Farida could discuss school options with her son and husband were discussed during session. ?Progress Toward Goal: progressing ? ?Short-term Objective addressed today:Nicole Boyd will be able to identify 5 coping resources/activities within 3 months. ?Mental health techniques used: Objective was addressed in session through the use of Cognitive Behavioral Therapy and discussion. Nicole Boyd's  response was positive -specifically the difference between situations that were within Victory's control where active coping strategies  may be beneficial for contrasted with situations where she does not have much control and the different coping resources that may be used for each situation were briefly discussed.  ?Progress Toward Goal: Progressing ?   ? ? ?PLAN  ?1. Nicole Boyd  will return for a therapy session.   ?2. Homework Given: Consider discussing school options with her son and husband. This homework will be reviewed with the family at the next visit.  ?3. During the next session discuss progress towards finding additional resources for her child as well as the stress level in the house.   ? ? ?Individual Treatment Plan - please see the notes from 3/28 for complete information  ?   ?Problem/Need: Anxiety / stress - Nicole Boyd reported a high level of anxiety and stress related to a situation with her son.  ?  ?Long-Term Goal #1: Reduce level of general distress and anxiety to a more manageable level as evidenced by patient report ?  ?Short-Term Objectives: ?Objective 1A: Nicole Boyd will be able to identify 5 coping resources/activities within 3 months.  ?Objective 1B: Nicole Boyd will be able to identify ineffective or negative coping strategies within 3-5 months. ?Objective 1C: Nicole Boyd will be able to replace ineffective or negative coping strategies with more adaptive strategies within 6 months. ?Interventions: Cognitive Behavioral Therapy, Motivational Interviewing, and Psycho-education/Bibliotherapy  coping skills, and other evidenced-based practices will be used to promote progress towards healthy functioning and to help manage decrease symptoms associated with their diagnosis.  ?Treatment Regimen: Individual bi-weekly skill building sessions for assist with treatment goal/objective ?Target Date: 08/2022 ?Responsible Party: therapist and patient ?Person delivering treatment: Licensed Psychologist [name], [name] will support the patient's ability to achieve the goals identified. ?Resolved:  No ? ?Problem/Need: Communication challenges - Nicole Boyd  reported some negative communication with her family members.  ?Long-Term Goal #2: Nicole Boyd will develop effective communication strategies to use with her family members.  ?Short-Term Objectives: ?Objective 2A: Nicole Boyd will be able to identify communication breakdowns with her family members within 3 months.  ?Objective 2B: Nicole Boyd will be able to practice different communication strategies in session within 6 months. ?Objective 2C: Shaneisha will be able to will try some of these communication strategies with her family members within 6 months. ?Interventions: Cognitive Behavioral Therapy, Roleplay, and Motivational Interviewing  and other evidenced-based practices will be used to promote progress towards healthy functioning and to help manage decrease symptoms associated with their diagnosis.  ?Treatment Regimen: Individual bi-weekly skill building sessions to work on treatment goal/objective ?Target Date: 08/2022 ?Responsible Party: therapist and patient ?Person delivering treatment: Licensed Psychologist [name], [name] will support the patient's ability to achieve the goals identified. ?Resolved:  No ? ? ?[name], [name] ?

## 2022-04-14 ENCOUNTER — Ambulatory Visit (INDEPENDENT_AMBULATORY_CARE_PROVIDER_SITE_OTHER): Payer: 59 | Admitting: Clinical

## 2022-04-14 DIAGNOSIS — F4322 Adjustment disorder with anxiety: Secondary | ICD-10-CM | POA: Diagnosis not present

## 2022-04-14 NOTE — Progress Notes (Signed)
[facility] Counselor/Therapist Progress Note ? ?Patient ID: Nicole Boyd, MRN: [id number]   ? ?Date: 04/14/22 ? ?Time Spent: 9:04 am - 10:04 am: 60 Minutes ? ?Type of Service Provided Individual Therapy  ?Type of Contact in-person ?Location: office       ?Provider Location: office ? ?Mental Status Exam: ?Appearance:  Neat and Well Groomed     ?Behavior: Appropriate  ?Motor: Normal  ?Speech/Language:  Normal Rate  ?Affect: Congruent  ?Mood: normal, at times sad or frustrated   ?Thought process: goal directed  ?Thought content:   WNL  ?Sensory/Perceptual disturbances:   WNL  ?Orientation: oriented to person, place, time/date, and situation  ?Attention: Good  ?Concentration: Good  ?Memory: WNL  ?Fund of knowledge:  Good  ?Insight:   Good  ?Judgment:  Fair to Good  ?Impulse Control: Good  ? ?Risk Assessment: No apparent indicators of HI or SI during the session ? ? ?Presenting Problems, Reported Symptoms, and /or Interim History: Nicole Boyd presented to a session related to managing distress.  She reported an increase in fatigue and the development of some hives that may be related to stress. ? ?Subjective: Nicole Boyd presented for an individual outpatient therapy session. The following was addressed during sessions.  ? ?Nicole Boyd reported that there are ongoing challenges with her son.  They have continued to look at schools and seek out options. Nicole Boyd has spoken to a number of other families about the things they have done for their children.  She indicated that she has been feeling tired and has developed some hives.  Therapist and Nicole Boyd discussed the physical indicators of stress that she is experiencing including tiredness and hives.  Given that she has found talking to other parents helpful it was suggested that she consider seeking out a support group for parents of children with neurodevelopmental and/or mental health issues for herself.  Nicole Boyd already engages in some coping and meditative time in the  morning.  Ways to incorporate small coping breaks (5 minutes) into her day was also discussed.  For example, making use of lunch or car rides to complete some of her guided meditations, stretching, getting into nature, were discussed.  She also noted that accomplishing small easy tasks increases her feelings of efficacy and was encouraged to look for small easy to complete tasks at home.  She reported distress at the idea of putting her son into a school setting that may not work out.  Ways to approach this possibility with herself and her son were discussed including reframing some of these opportunities as tries or short-term practice.  In addition ways that Nicole Boyd can manage distress in herself when her son is having an outburst were discussed including ways to communicate boundaries that although she is supportive of him, she has not required  bear the brunt of his unhappiness at all times, especially when his anger and unhappiness is directed to her. ? ?Interventions/Psychotherapy Techniques Used During Session: Cognitive Behavioral Therapy and Assertiveness/Communication ? ?Diagnosis: Adjustment disorder with anxious mood ? ?MENTAL HEALTH INTERVENTIONS USED DURING TREATMENT & PATIENT'S RESPONSE TO INTERVENTIONS:  ?Short-term Objective addressed today:Nicole Boyd will be able to identify 5 coping resources/activities within 3 months.  ?Mental health techniques used: Objective was addressed in session through the use of Cognitive Behavioral Therapy and Assertiveness/Communication, discussion, and making/reviewing lists. Nicole Boyd's response was generally positive -therapist and Nicole Boyd were able to identify a number of potential coping strategies she could use during the day.  ?Progress Toward Goal: Progressing ? ?Short-term Objective addressed  today:Nicole Boyd will be able to identify communication breakdowns with her family members within 3 months.  ?AND Nicole Boyd will be able to practice different communication strategies in  session within 6 months. ?Mental health techniques used: Objective was addressed in session through the use of Cognitive Behavioral Therapy and Assertiveness/Communication and discussion. Nicole Boyd's response was mostly positive -a new way to communicate and set boundaries with her son were discussed.  ?Progress Toward Goal: Some progress ?   ?PLAN  ?1. Nicole Boyd will return for a therapy session.   ?2. Homework Given: Try to find times during the day that Nicole Boyd can incorporate calming or coping activities, try a new plan with her son when she he is having an upset. This homework will be reviewed with Nicole Boyd at the next visit.  ?3. During the next session to check in on coping activities, check in on stress level, obtain updates on stressors.   ? ? ?Nicole Chess, Nicole Boyd ? ?Individual Treatment Plan - please see the notes from 3/28 for complete information  ?   ?Problem/Need: Anxiety / stress - Arlenne reported a high level of anxiety and stress related to a situation with her son.  ?  ?Long-Term Goal #1: Reduce level of general distress and anxiety to a more manageable level as evidenced by patient report ?  ?Short-Term Objectives: ?Objective 1A: Nicole Boyd will be able to identify 5 coping resources/activities within 3 months.  ?Objective 1B: Nicole Boyd will be able to identify ineffective or negative coping strategies within 3-5 months. ?Objective 1C: Nicole Boyd will be able to replace ineffective or negative coping strategies with more adaptive strategies within 6 months. ?Interventions: Cognitive Behavioral Therapy, Motivational Interviewing, and Psycho-education/Bibliotherapy  coping skills, and other evidenced-based practices will be used to promote progress towards healthy functioning and to help manage decrease symptoms associated with their diagnosis.  ?Treatment Regimen: Individual bi-weekly skill building sessions for assist with treatment goal/objective ?Target Date: 08/2022 ?Responsible Party: therapist and patient ?Person  delivering treatment: Licensed Psychologist Nicole Chess, Nicole Boyd will support the patient's ability to achieve the goals identified. ?Resolved:  No ? ?Problem/Need: Communication challenges - Kaelah reported some negative communication with her family members.  ?Long-Term Goal #2: Emony will develop effective communication strategies to use with her family members.  ?Short-Term Objectives: ?Objective 2A: Ophelia will be able to identify communication breakdowns with her family members within 3 months.  ?Objective 2B: Maleea will be able to practice different communication strategies in session within 6 months. ?Objective 2C: Ahsha will be able to will try some of these communication strategies with her family members within 6 months. ?Interventions: Cognitive Behavioral Therapy, Roleplay, and Motivational Interviewing  and other evidenced-based practices will be used to promote progress towards healthy functioning and to help manage decrease symptoms associated with their diagnosis.  ?Treatment Regimen: Individual bi-weekly skill building sessions to work on treatment goal/objective ?Target Date: 08/2022 ?Responsible Party: therapist and patient ?Person delivering treatment: Licensed Psychologist Nicole Chess, Nicole Boyd will support the patient's ability to achieve the goals identified. ?Resolved:  No ? ? ?Nicole Chess, Nicole Boyd ?

## 2022-04-28 ENCOUNTER — Ambulatory Visit (INDEPENDENT_AMBULATORY_CARE_PROVIDER_SITE_OTHER): Payer: 59 | Admitting: Clinical

## 2022-04-28 DIAGNOSIS — F4322 Adjustment disorder with anxiety: Secondary | ICD-10-CM

## 2022-04-28 NOTE — Progress Notes (Signed)
[facility] Counselor/Therapist Progress Note ? ?Patient ID: Nicole Boyd, MRN: [id number]   ? ?Date: 04/28/22 ? ?Time Spent: 9:03 am - 10:04 am: 61 Minutes ? ?Type of Service Provided Individual Therapy  ?Type of Contact in-person ?Location: office ? ?Mental Status Exam: ?Appearance:  Neat and Well Groomed     ?Behavior: Sharing  ?Motor: Normal  ?Speech/Language:  Clear and Coherent  ?Affect: Appropriate but a bit restricted  ?Mood: normal  ?Thought process: normal  ?Thought content:   WNL  ?Sensory/Perceptual disturbances:   WNL  ?Orientation: oriented to person, place, time/date, and situation  ?Attention: Good  ?Concentration: Fair  ?Memory: WNL  ?Fund of knowledge:  Fair  ?Insight:   Fair  ?Judgment:  Fair  ?Impulse Control: Good  ? ?Risk Assessment: No apparent indicators of SI or HI during the current session ? ?Presenting Problems, Reported Symptoms, and /or Interim History: Nicole Boyd presented for a therapy session to address the stress associated with the situation with her son. ? ?Subjective: Nicole Boyd presented for an individual outpatient therapy session. The following was addressed during sessions.  ? ?Nicole Boyd reported that her son has not been verbally acting out as much as he had been but now seems more withdrawn.  She has continued to try to make boundaries when he is using negative language with her to appropriately communicate that she does not have to stay present if he is going to talk to her in a very negative way.  They continue to struggle to find him an appropriate therapeutic and school environment. Nicole Boyd was encouraged to contact her insurance company to determine if any of the residential treatment programs (or a portion of the program) they are considering could be covered by their health insurance.  The difference between the ideal solution and what may be an acceptable solution was discussed. Nicole Boyd was asked to think about what would help her to feel less anxious about making  a choice regarding the least bad option of those that are actually available to them.  She has been engaging in some 5-minute coping breaks during the day, though she reported not as frequently as may be necessary.  Nevertheless when she has engaged in these activities she has found it helpful.  Nicole Boyd was encouraged to continue to look for a support group.  Strategies for trying to change some of the interactions with her child to reduce some of the negativity if possible was discussed, such as by increasing the specificity of the check list he use when he is engaging in his cleaning activities.  The idea that the entire family is stuck and experiencing depressive symptoms was discussed given the context of the situation that they find themselves in. ? ?Interventions/Psychotherapy Techniques Used During Session: Cognitive Behavioral Therapy and Insight-Oriented ? ?Diagnosis: Adjustment disorder with anxious mood ? ?MENTAL HEALTH INTERVENTIONS USED DURING TREATMENT & PATIENT'S RESPONSE TO INTERVENTIONS:  ?Short-term Objective addressed today:Nicole Boyd will be able to identify 5 coping resources/activities within 3 months.  ?Mental health techniques used: Objective was addressed in session through the use of Cognitive Behavioral Therapy and discussion. Nicole Boyd's response was mixed -she reported that she has engaged in some use of coping strategies but sometimes has difficulty thinking of ones to complete and executing them.  ?Progress Toward Goal: Some progress ? ?Short-term Objective addressed today:Nicole Boyd will be able to practice different communication strategies in session within 6 months. ?Mental health techniques used: Objective was addressed in session through the use of Cognitive Behavioral Therapy and discussion.  Nicole Boyd's response was mostly positive -different strategies to use to try to reduce the negative interaction between her and her son were discussed.  ?Progress Toward Goal: Some progress ?   ? ?PLAN   ?1. Nicole Boyd will return for a therapy session.   ?2. Homework Given: Contact the insurance company to determine if any portion of her child treatment could be covered, begin to think about what she needs to be able to decide between the options they have available. This homework will be reviewed with Nicole Boyd at the next visit.  ?3. During the next session discuss her son's next steps, discussed coping breaks, discussed support groups.   ? ? ?Nicole Chess, [name] ? Individual Treatment Plan - please see the notes from 3/28 for complete information  ?   ?Problem/Need: Anxiety / stress - Nicole Boyd reported a high level of anxiety and stress related to a situation with her son.  ?  ?Long-Term Goal #1: Reduce level of general distress and anxiety to a more manageable level as evidenced by patient report ?  ?Short-Term Objectives: ?Objective 1A: Nicole Boyd will be able to identify 5 coping resources/activities within 3 months.  ?Objective 1B: Nicole Boyd will be able to identify ineffective or negative coping strategies within 3-5 months. ?Objective 1C: Nicole Boyd will be able to replace ineffective or negative coping strategies with more adaptive strategies within 6 months. ?Interventions: Cognitive Behavioral Therapy, Motivational Interviewing, and Psycho-education/Bibliotherapy  coping skills, and other evidenced-based practices will be used to promote progress towards healthy functioning and to help manage decrease symptoms associated with their diagnosis.  ?Treatment Regimen: Individual bi-weekly skill building sessions for assist with treatment goal/objective ?Target Date: 08/2022 ?Responsible Party: therapist and patient ?Person delivering treatment: Licensed Psychologist Nicole Chess, [name] will support the patient's ability to achieve the goals identified. ?Resolved:  No ? ?Problem/Need: Communication challenges - Nicole Boyd reported some negative communication with her family members.  ?Long-Term Goal #2: Nicole Boyd will develop effective  communication strategies to use with her family members.  ?Short-Term Objectives: ?Objective 2A: Angelyse will be able to identify communication breakdowns with her family members within 3 months.  ?Objective 2B: Leveta will be able to practice different communication strategies in session within 6 months. ?Objective 2C: Kayelynn will be able to will try some of these communication strategies with her family members within 6 months. ?Interventions: Cognitive Behavioral Therapy, Roleplay, and Motivational Interviewing  and other evidenced-based practices will be used to promote progress towards healthy functioning and to help manage decrease symptoms associated with their diagnosis.  ?Treatment Regimen: Individual bi-weekly skill building sessions to work on treatment goal/objective ?Target Date: 08/2022 ?Responsible Party: therapist and patient ?Person delivering treatment: Licensed Psychologist Nicole Chess, [name] will support the patient's ability to achieve the goals identified. ?Resolved:  No ? ? ? ?Nicole Chess, [name] ?

## 2022-05-12 ENCOUNTER — Ambulatory Visit (INDEPENDENT_AMBULATORY_CARE_PROVIDER_SITE_OTHER): Payer: 59 | Admitting: Clinical

## 2022-05-12 DIAGNOSIS — F4322 Adjustment disorder with anxiety: Secondary | ICD-10-CM | POA: Diagnosis not present

## 2022-05-12 NOTE — Progress Notes (Signed)
[facility] Counselor/Therapist Progress Note  Patient ID: Nicole Boyd, MRN: [id number]    Date: 05/12/22  Time Spent: 9:00 am - 10:00 am: 60 Minutes  Type of Service Provided Individual Therapy  Type of Contact in-person Location: office   Mental Status Exam: Appearance:  Neat and Well Groomed     Behavior: Appropriate  Motor: Normal  Speech/Language:  Clear and Coherent  Affect: Appropriate  Mood: normal  Thought process: normal  Thought content:   WNL  Sensory/Perceptual disturbances:   WNL  Orientation: oriented to person, place, time/date, and situation  Attention: Good  Concentration: Good  Memory: WNL  Fund of knowledge:  Good  Insight:   Fair  Judgment:  Good  Impulse Control: Good   Risk Assessment: No apparent indicators of HI or SI during the visit  Presenting Problems, Reported Symptoms, and /or Interim History: Nicole Boyd presented to the visit to continue to address challenges with stress related to some difficult family situations.  Subjective: Nicole Boyd presented for an individual outpatient therapy session. The following was addressed during sessions.   Nicole Boyd reported they have made some positive progress in determining next steps for her son but expressed frustration that they keep being referred to different places, as well as the lack of good case management for her child.  Several instances where Nicole Boyd had interactions with her son were discussed with Nicole Boyd trying to provide him additional space to process.  This seems to have been somewhat helpful.  Her son's reported difficulty with cognitive flexibility and how she may communicate with him in a way to encourage flexibility in a gentle way was discussed.  She noted that she is experiencing a high level of distress but has been unable to fully process or experience this given the ongoing stressors.  Therapist and Nicole Boyd discussed that it is possible that she will experience a high level of  emotion once the immediate stressors are no longer as pressing.  Interventions/Psychotherapy Techniques Used During Session: Cognitive Behavioral Therapy and Assertiveness/Communication  Diagnosis: Adjustment disorder with anxious mood  MENTAL HEALTH INTERVENTIONS USED DURING TREATMENT & PATIENT'S RESPONSE TO INTERVENTIONS:  Short-term Objective addressed today: Nicole Boyd will be able to will try some of these communication strategies with her family members within 6 months. Mental health techniques used: Objective was addressed in session through the use of  Cognitive Behavioral Therapy and Assertiveness/Communication and discussion. Nicole Boyd's response was generally positive.  Progress Toward Goal: progressing   PLAN  1. Nicole Boyd  will return for a therapy session.   2. Homework Given:  continue to try to incorporate calming activities into her day. This homework will be reviewed with Nicole Boyd at the next visit.  3. During the next session get the update for her son and stress management for herself.     Nicole Chess, [name]    Individual Treatment Plan - please see the notes from 3/28 for complete information     Problem/Need: Anxiety / stress - Nicole Boyd reported a high level of anxiety and stress related to a situation with her son.    Long-Term Goal #1: Reduce level of general distress and anxiety to a more manageable level as evidenced by patient report   Short-Term Objectives: Objective 1A: Nicole Boyd will be able to identify 5 coping resources/activities within 3 months.  Objective 1B: Nicole Boyd will be able to identify ineffective or negative coping strategies within 3-5 months. Objective 1C: Nicole Boyd will be able to replace ineffective or negative coping strategies with more adaptive strategies  within 6 months. Interventions: Cognitive Behavioral Therapy, Motivational Interviewing, and Psycho-education/Bibliotherapy  coping skills, and other evidenced-based practices will be used to promote  progress towards healthy functioning and to help manage decrease symptoms associated with their diagnosis.  Treatment Regimen: Individual bi-weekly skill building sessions for assist with treatment goal/objective Target Date: 08/2022 Responsible Party: therapist and patient Person delivering treatment: Licensed Psychologist Nicole Chess, [name] will support the patient's ability to achieve the goals identified. Resolved:  No  Problem/Need: Communication challenges - Nicole Boyd reported some negative communication with her family members.  Long-Term Goal #2: Nicole Boyd will develop effective communication strategies to use with her family members.  Short-Term Objectives: Objective 2A: Nicole Boyd will be able to identify communication breakdowns with her family members within 3 months.  Objective 2B: Nicole Boyd will be able to practice different communication strategies in session within 6 months. Objective 2C: Nicole Boyd will be able to will try some of these communication strategies with her family members within 6 months. Interventions: Cognitive Behavioral Therapy, Roleplay, and Motivational Interviewing  and other evidenced-based practices will be used to promote progress towards healthy functioning and to help manage decrease symptoms associated with their diagnosis.  Treatment Regimen: Individual bi-weekly skill building sessions to work on treatment goal/objective Target Date: 08/2022 Responsible Party: therapist and patient Person delivering treatment: Licensed Psychologist Nicole Chess, [name] will support the patient's ability to achieve the goals identified. Resolved:  No  Nicole Chess, [name]

## 2022-05-26 ENCOUNTER — Ambulatory Visit (INDEPENDENT_AMBULATORY_CARE_PROVIDER_SITE_OTHER): Payer: 59 | Admitting: Clinical

## 2022-05-26 DIAGNOSIS — F4322 Adjustment disorder with anxiety: Secondary | ICD-10-CM | POA: Diagnosis not present

## 2022-05-26 NOTE — Progress Notes (Signed)
[facility] Counselor/Therapist Progress Note  Patient ID: Nicole Boyd, MRN: [id number]    Date: 05/26/22  Time Spent: 9:03 am - 10:03 am: 60 Minutes  Type of Service Provided Individual Therapy  Type of Contact in-person Location: office   Mental Status Exam: Appearance:  Neat and Well Groomed     Behavior: Appropriate and Sharing  Motor: Normal  Speech/Language:  Clear and Coherent and Normal Rate  Affect: Appropriate  Mood: normal  Thought process: goal directed  Thought content:   WNL  Sensory/Perceptual disturbances:   WNL  Orientation: oriented to person, place, time/date, and situation  Attention: Good  Concentration: Fair  Memory: WNL  Fund of knowledge:  Fair  Insight:   Fair  Judgment:  Fair  Impulse Control: Good   Risk Assessment: No apparent indicators of SI or HI during the visit   Presenting Problems, Reported Symptoms, and /or Interim History: Chariah presented to the session to address ongoing life stress.  Subjective: Laniece presented for an individual outpatient therapy session. The following was addressed during sessions.   Deliah reported many positive strides in the situation with her son including making forward progress on intensive in-home services, possible DBT, and possible ABA therapy.  The family also identified a school that they are pretty sure they are going to send their child to next year. Tayelor's emotions regarding these positive moves forward were discussed.  A very stressful situation with her son was also discussed but Adelheid reported that she felt that she and her family manage the situation well. Breeanne reported that her son has been less outwardly aggressive but seems more sad.  Continuing to take time for herself and encouraging her family members to also take time for themselves was discussed. Naylah reported an increase in work stress as well.  Ways to try to balance the stressors she is experiencing at home with the  stressors she is experiencing at work were discussed along with prioritizing building in time to transition between environments and coping breaks throughout her days and weeks to help sustain her in this difficult time period.   Interventions/Psychotherapy Techniques Used During Session: Cognitive Behavioral Therapy  Diagnosis: Adjustment disorder with anxious mood  MENTAL HEALTH INTERVENTIONS USED DURING TREATMENT & PATIENT'S RESPONSE TO INTERVENTIONS:  Short-term Objective addressed today: Shirely will be able to replace ineffective or negative coping strategies with more adaptive strategies within 6 months. Mental health techniques used: Objective was addressed in session through the use of Cognitive Behavioral Therapy and discussion. Jacqulyn's response was positive.  Progress Toward Goal: progressing   PLAN  1. Mckell  will return for a therapy session.   2. Homework Given:  try to carve out a few minutes a day to transition between work and home stress, put short coping activities into the daily schedule. This homework will be reviewed with [name] at the next visit.  3. During the next session check-in on work and home stress, check in on summer plans and progress for her child in obtaining the necessary services.     [name], [name]   Individual Treatment Plan - please see the notes from 3/28 for complete information     Problem/Need: Anxiety / stress - Francetta reported a high level of anxiety and stress related to a situation with her son.    Long-Term Goal #1: Reduce level of general distress and anxiety to a more manageable level as evidenced by patient report   Short-Term Objectives: Objective 1A: Phoua will be  able to identify 5 coping resources/activities within 3 months.  Objective 1B: Dakari will be able to identify ineffective or negative coping strategies within 3-5 months. Objective 1C: Aloni will be able to replace ineffective or negative coping strategies with more  adaptive strategies within 6 months. Interventions: Cognitive Behavioral Therapy, Motivational Interviewing, and Psycho-education/Bibliotherapy  coping skills, and other evidenced-based practices will be used to promote progress towards healthy functioning and to help manage decrease symptoms associated with their diagnosis.  Treatment Regimen: Individual bi-weekly skill building sessions for assist with treatment goal/objective Target Date: 08/2022 Responsible Party: therapist and patient Person delivering treatment: Licensed Psychologist [name], [name] will support the patient's ability to achieve the goals identified. Resolved:  No  Problem/Need: Communication challenges - Nanako reported some negative communication with her family members.  Long-Term Goal #2: Brigida will develop effective communication strategies to use with her family members.  Short-Term Objectives: Objective 2A: Jaiana will be able to identify communication breakdowns with her family members within 3 months.  Objective 2B: Dollye will be able to practice different communication strategies in session within 6 months. Objective 2C: Lamyia will be able to will try some of these communication strategies with her family members within 6 months. Interventions: Cognitive Behavioral Therapy, Roleplay, and Motivational Interviewing  and other evidenced-based practices will be used to promote progress towards healthy functioning and to help manage decrease symptoms associated with their diagnosis.  Treatment Regimen: Individual bi-weekly skill building sessions to work on treatment goal/objective Target Date: 08/2022 Responsible Party: therapist and patient Person delivering treatment: Licensed Psychologist [name], [name] will support the patient's ability to achieve the goals identified. Resolved:  No  [name], [name]

## 2022-06-09 ENCOUNTER — Ambulatory Visit (INDEPENDENT_AMBULATORY_CARE_PROVIDER_SITE_OTHER): Payer: 59 | Admitting: Clinical

## 2022-06-09 DIAGNOSIS — F4322 Adjustment disorder with anxiety: Secondary | ICD-10-CM

## 2022-06-09 NOTE — Progress Notes (Signed)
Nicole Boyd Counselor/Therapist Progress Note  Patient ID: Nicole Boyd, MRN: [id number]    Date: 06/09/22  Time Spent: 9:02 am - 10:00 am: 67 Minutes  Type of Service Provided Individual Therapy  Type of Contact in-person Location: office   Mental Status Exam: Appearance:  Neat and Well Groomed     Behavior: Appropriate  Motor: Normal  Speech/Language:  Clear and Coherent and Normal Rate  Affect: Appropriate  Mood: normal  Thought process: normal  Thought content:   WNL  Sensory/Perceptual disturbances:   WNL  Orientation: oriented to person, place, time/date, and situation  Attention: Good  Concentration: Fair  Memory: WNL  Fund of knowledge:  Good  Insight:   Fair  Judgment:  Fair  Impulse Control: Good   Risk Assessment: No apparent indicators of SI or HI during the visit   Presenting Problems, Reported Symptoms, and /or Interim History: Nicole Boyd presented to a session to address the ongoing situation with her son and the stressors associated with that.  Subjective: Nicole Boyd presented for an individual outpatient therapy session. The following was addressed during sessions.   Nicole Boyd reported that they have made some progress in obtaining supports for her son (he started working with a new DBT therapist) but are continuing to explore options in other areas and have continued to run into some barriers.  Family was encouraged to continue seeking the more intensive services.  Her son has continued to react negatively to Nicole Boyd trying to provide feedback and reminders regarding expectations and schedules.  She has continued to try to demonstrate appropriate boundaries by walking away when he is engaging with her in a negative verbal manner.  Alternative ways to communicate expectations and explain logical consequences to him were discussed.  Ways to continue to try to provide her son with the support that he needs while also transferring some of the responsibility for  his daily tasks from herself to her son was discussed (for example we discussed the use of reminder apps). Nicole Boyd reported that she has an alarm set to go off 3 times a day to remind her to do coping activities but she is not always successful at stopping what she is doing to engage in the activities.  She was able to commit to doing the activities at least once a day when the alarm went off.  Interventions/Psychotherapy Techniques Used During Session: Cognitive Behavioral Therapy, Assertiveness/Communication, and Psycho-education/Bibliotherapy  Diagnosis: Adjustment disorder with anxious mood  MENTAL HEALTH INTERVENTIONS USED DURING TREATMENT & PATIENT'S RESPONSE TO INTERVENTIONS:  Short-term Objective addressed today:Nicole Boyd will be able to will try some of these communication strategies with her family members within 6 months. Mental health techniques used: Objective was addressed in session through the use of Assertiveness/Communication and Psycho-education/Bibliotherapy and discussion. Nicole Boyd's response was positive.  Progress Toward Goal: Progressing  Short-term Objective addressed today:Nicole Boyd will be able to replace ineffective or negative coping strategies with more adaptive strategies within 6 months. Mental health techniques used: Objective was addressed in session through the use of Cognitive Behavioral Therapy and Psycho-education/Bibliotherapy and discussion. Nicole Boyd's response was positive.  Progress Toward Goal: Progressing    PLAN  1. Nicole Boyd will return for a therapy session.   2. Homework Given: Implement the coping strategies or calming activities at least 1 out of 3 times when her alarm goes off, continue to use appropriate boundary setting to reduce the amount of negative verbal interactions that she has with her son. This homework will be reviewed with Nicole Boyd at the  next visit.  3. During the next session check-in on anxiety and mood, and son's situation.     Nicole Chess,  [name]    Individual Treatment Plan - please see the notes from 3/28 for complete information     Problem/Need: Anxiety / stress - Nicole Boyd reported a high level of anxiety and stress related to a situation with her son.    Long-Term Goal #1: Reduce level of general distress and anxiety to a more manageable level as evidenced by patient report   Short-Term Objectives: Objective 1A: Nicole Boyd will be able to identify 5 coping resources/activities within 3 months.  Objective 1B: Nicole Boyd will be able to identify ineffective or negative coping strategies within 3-5 months. Objective 1C: Nicole Boyd will be able to replace ineffective or negative coping strategies with more adaptive strategies within 6 months. Interventions: Cognitive Behavioral Therapy, Motivational Interviewing, and Psycho-education/Bibliotherapy  coping skills, and other evidenced-based practices will be used to promote progress towards healthy functioning and to help manage decrease symptoms associated with their diagnosis.  Treatment Regimen: Individual bi-weekly skill building sessions for assist with treatment goal/objective Target Date: 08/2022 Responsible Party: therapist and patient Person delivering treatment: Licensed Psychologist Nicole Chess, [name] will support the patient's ability to achieve the goals identified. Resolved:  No  Problem/Need: Communication challenges - Nicole Boyd reported some negative communication with her family members.  Long-Term Goal #2: Nicole Boyd will develop effective communication strategies to use with her family members.  Short-Term Objectives: Objective 2A: Nicole Boyd will be able to identify communication breakdowns with her family members within 3 months.  Objective 2B: Nicole Boyd will be able to practice different communication strategies in session within 6 months. Objective 2C: Nicole Boyd will be able to will try some of these communication strategies with her family members within 6 months. Interventions:  Cognitive Behavioral Therapy, Roleplay, and Motivational Interviewing  and other evidenced-based practices will be used to promote progress towards healthy functioning and to help manage decrease symptoms associated with their diagnosis.  Treatment Regimen: Individual bi-weekly skill building sessions to work on treatment goal/objective Target Date: 08/2022 Responsible Party: therapist and patient Person delivering treatment: Licensed Psychologist Nicole Chess, [name] will support the patient's ability to achieve the goals identified. Resolved:  No  Nicole Chess, [name]

## 2022-06-19 ENCOUNTER — Other Ambulatory Visit: Payer: Self-pay | Admitting: Internal Medicine

## 2022-06-25 ENCOUNTER — Ambulatory Visit (INDEPENDENT_AMBULATORY_CARE_PROVIDER_SITE_OTHER): Payer: 59 | Admitting: Clinical

## 2022-06-25 DIAGNOSIS — F4322 Adjustment disorder with anxiety: Secondary | ICD-10-CM

## 2022-06-25 NOTE — Progress Notes (Signed)
[facility] Counselor/Therapist Progress Note  Patient ID: [name], MRN: [id number]    Date: 06/25/22  Time Spent: 9:00 am - 9:58 am: 49 Minutes  Type of Service Provided Individual Therapy  Type of Contact in-person Location: office  Mental Status Exam: Appearance:  Neat and Well Groomed     Behavior: Sharing and Rationalizing  Motor: Normal  Speech/Language:  Clear and Coherent  Affect: Appropriate  Mood: normal  Thought process: normal  Thought content:   WNL  Sensory/Perceptual disturbances:   WNL  Orientation: oriented to person, place, time/date, and situation  Attention: Good  Concentration: Good  Memory: WNL  Fund of knowledge:  Good  Insight:   Fair  Judgment:  Fair  Impulse Control: Good   Risk Assessment: No apparent indicator of SI or HI during the visit   Presenting Problems, Reported Symptoms, and /or Interim History: Nicole Boyd presented for a visit to discuss anxiety and life stress  Subjective: Nicole Boyd presented for an individual outpatient therapy session. The following was addressed during sessions.   Nicole Boyd reported that things have continued to be similar at her home.  Her son continues to have periods of upset but they have been able to put activities into his week and have continued to practice removing themselves from situations when they escalate.  He is supposed to start more intensive services and how to best utilize these services was discussed Nicole Boyd discussed some of the anxieties that she is experiencing related to the situation including significant stress about the future and concerns when comparing him to other children.  She reported that she has been trying to do some calming or coping activities though not as consistently as maybe she should.  Nevertheless she reported that she had talked to her spouse about this and that they both were working to try to have breaks and opportunities incorporated into their day to day lives.    Interventions/Psychotherapy Techniques Used During Session: Cognitive Behavioral Therapy  Diagnosis: Adjustment disorder with anxious mood  MENTAL HEALTH INTERVENTIONS USED DURING TREATMENT & PATIENT'S RESPONSE TO INTERVENTIONS:  Short-term Objective addressed today:Nicole Boyd will be able to will try some of these communication strategies with her family members within 6 months. Mental health techniques used: Objective was addressed in session through the use of Cognitive Behavioral Therapy and discussion. Nicole Boyd's response was generally positive.  Progress Toward Goal: Some progress  Short-term Objective addressed today:Nicole Boyd will be able to replace ineffective or negative coping strategies with more adaptive strategies within 6 months. Mental health techniques used: Objective was addressed in session through the use of Cognitive Behavioral Therapy and discussion. Nicole Boyd's response was generally positive.  Progress Toward Goal: Some progress    PLAN  1. Nicole Boyd  will return for a therapy session.   2. Homework Given: Continue trying to incorporate calming activities, consider seeking support from other families going through similar situations. This homework will be reviewed with Nicole Boyd at the next visit.  3. During the next session continue to address anxiety and short and long-term plans for her son.     Nicole Chess, [name]     Individual Treatment Plan - please see the notes from 3/28 for complete information     Problem/Need: Anxiety / stress - Ettel reported a high level of anxiety and stress related to a situation with her son.    Long-Term Goal #1: Reduce level of general distress and anxiety to a more manageable level as evidenced by patient report   Short-Term Objectives:  Objective 1A: Shayden will be able to identify 5 coping resources/activities within 3 months.  Objective 1B: Kameisha will be able to identify ineffective or negative coping strategies within 3-5  months. Objective 1C: Eilyn will be able to replace ineffective or negative coping strategies with more adaptive strategies within 6 months. Interventions: Cognitive Behavioral Therapy, Motivational Interviewing, and Psycho-education/Bibliotherapy  coping skills, and other evidenced-based practices will be used to promote progress towards healthy functioning and to help manage decrease symptoms associated with their diagnosis.  Treatment Regimen: Individual bi-weekly skill building sessions for assist with treatment goal/objective Target Date: 08/2022 Responsible Party: therapist and patient Person delivering treatment: Licensed Psychologist Nicole Chess, [name] will support the patient's ability to achieve the goals identified. Resolved:  No  Problem/Need: Communication challenges - Moreen reported some negative communication with her family members.  Long-Term Goal #2: Syble will develop effective communication strategies to use with her family members.  Short-Term Objectives: Objective 2A: Nakya will be able to identify communication breakdowns with her family members within 3 months.  Objective 2B: Bellami will be able to practice different communication strategies in session within 6 months. Objective 2C: Shandon will be able to will try some of these communication strategies with her family members within 6 months. Interventions: Cognitive Behavioral Therapy, Roleplay, and Motivational Interviewing  and other evidenced-based practices will be used to promote progress towards healthy functioning and to help manage decrease symptoms associated with their diagnosis.  Treatment Regimen: Individual bi-weekly skill building sessions to work on treatment goal/objective Target Date: 08/2022 Responsible Party: therapist and patient Person delivering treatment: Licensed Psychologist Nicole Chess, [name] will support the patient's ability to achieve the goals identified. Resolved:  No  Nicole Chess,  [name]

## 2022-07-16 ENCOUNTER — Ambulatory Visit (INDEPENDENT_AMBULATORY_CARE_PROVIDER_SITE_OTHER): Payer: 59 | Admitting: Clinical

## 2022-07-16 DIAGNOSIS — F4322 Adjustment disorder with anxiety: Secondary | ICD-10-CM

## 2022-07-16 NOTE — Progress Notes (Signed)
[facility] Counselor/Therapist Progress Note  Patient ID: Nicole Boyd, MRN: [id number]    Date: 07/16/22  Time Spent: 1:00 pm - 1:58 pm: 58 Minutes  Type of Service Provided Individual Therapy  Type of Contact in-person Location: office  Mental Status Exam: Appearance:  Neat and Well Groomed     Behavior: Appropriate and Sharing - Nicole Boyd was a bit more forthcoming about the emotional impact of what has been occurring at home during the current session than she has at times previously  Motor: Normal  Speech/Language:  Clear and Coherent  Affect: Appropriate, at times appeared to be becoming slightly tearful  Mood: normal  Thought process: normal  Thought content:   WNL  Sensory/Perceptual disturbances:   WNL  Orientation: oriented to person, place, time/date, and situation  Attention: Good  Concentration: Good  Memory: WNL  Fund of knowledge:  Good  Insight:   Fair  Judgment:  Fair  Impulse Control: Good   Risk Assessment: No apparent indicators of SI or HI during the visit  Presenting Problems, Reported Symptoms, and /or Interim History: Nicole Boyd presented to the visit to discuss anxiety and stress.  Subjective: Nicole Boyd presented for an individual outpatient therapy session. The following was addressed during sessions.   Nicole Boyd reported that they have started to engage in some of the services for her son.  However everyone in the family is struggling significantly as his behavior continues to be intense and both she and her husband worry about his long-term outcome.  The benefit of having some of the services and a potential school identified for next year were discussed.  However, the idea that the last period of time has also been a trauma for Nicole Boyd was discussed and therapist noted that as things improve Nicole Boyd may benefit from more direct trauma work. Nicole Boyd was able to use some of her coping skills during the week but distress levels in the household remain  high.  Stressors associated with work were also discussed.  Therapist continued to suggest involvement with other families going through similar circumstances and seeking out parent support.   Interventions/Psychotherapy Techniques Used During Session: Cognitive Behavioral Therapy and Insight-Oriented  Diagnosis: Adjustment disorder with anxious mood  MENTAL HEALTH INTERVENTIONS USED DURING TREATMENT & PATIENT'S RESPONSE TO INTERVENTIONS:  Short-term Objective addressed today:Tenia will be able to replace ineffective or negative coping strategies with more adaptive strategies within 6 months. Mental health techniques used: Objective was addressed in session through the use of Cognitive Behavioral Therapy and discussion. Mykira's  response was generally positive.  Progress Toward Goal: Some progress  Short-term Objective addressed today:Nicole Boyd will be able to will try some of these communication strategies with her family members within 6 months. Mental health techniques used: Objective was addressed in session through the use of Cognitive Behavioral Therapy and Insight-Oriented and discussion. Nicole Boyd's response was positive.  Progress Toward Goal: Some progress -she has continued to remove herself from situations when positive communication is not feasible and continued to work on approaching others using different communication strategies.     PLAN  1. Nicole Boyd  will return for a therapy session.   2. Homework Given: Continue to engage in strategies to try to manage the very high distress level over the course of the weeks. This homework will be reviewed with Nicole Boyd at the next visit.  3. During the next session discuss anxiety, stress, future plans for her child.     Nicole Chess, [name]     Individual Treatment Plan -  please see the notes from 3/28 for complete information     Problem/Need: Anxiety / stress - Nicole Boyd reported a high level of anxiety and stress related to a situation with  her son.    Long-Term Goal #1: Reduce level of general distress and anxiety to a more manageable level as evidenced by patient report   Short-Term Objectives: Objective 1A: Nicole Boyd will be able to identify 5 coping resources/activities within 3 months.  Objective 1B: Nicole Boyd will be able to identify ineffective or negative coping strategies within 3-5 months. Objective 1C: Nicole Boyd will be able to replace ineffective or negative coping strategies with more adaptive strategies within 6 months. Interventions: Cognitive Behavioral Therapy, Motivational Interviewing, and Psycho-education/Bibliotherapy  coping skills, and other evidenced-based practices will be used to promote progress towards healthy functioning and to help manage decrease symptoms associated with their diagnosis.  Treatment Regimen: Individual bi-weekly skill building sessions for assist with treatment goal/objective Target Date: 08/2022 Responsible Party: therapist and patient Person delivering treatment: Licensed Psychologist Nicole Chess, [name] will support the patient's ability to achieve the goals identified. Resolved:  No  Problem/Need: Communication challenges - Nicole Boyd reported some negative communication with her family members.  Long-Term Goal #2: Nicole Boyd will develop effective communication strategies to use with her family members.  Short-Term Objectives: Objective 2A: Nicole Boyd will be able to identify communication breakdowns with her family members within 3 months.  Objective 2B: Nicole Boyd will be able to practice different communication strategies in session within 6 months. Objective 2C: Nicole Boyd will be able to will try some of these communication strategies with her family members within 6 months. Interventions: Cognitive Behavioral Therapy, Roleplay, and Motivational Interviewing  and other evidenced-based practices will be used to promote progress towards healthy functioning and to help manage decrease symptoms associated with  their diagnosis.  Treatment Regimen: Individual bi-weekly skill building sessions to work on treatment goal/objective Target Date: 08/2022 Responsible Party: therapist and patient Person delivering treatment: Licensed Psychologist Nicole Chess, [name] will support the patient's ability to achieve the goals identified. Resolved:  No   Nicole Chess, [name]

## 2022-07-28 ENCOUNTER — Ambulatory Visit (INDEPENDENT_AMBULATORY_CARE_PROVIDER_SITE_OTHER): Payer: 59 | Admitting: Clinical

## 2022-07-28 DIAGNOSIS — F4322 Adjustment disorder with anxiety: Secondary | ICD-10-CM | POA: Diagnosis not present

## 2022-07-28 NOTE — Progress Notes (Addendum)
[facility] Counselor/Therapist Progress Note  Patient ID: Nicole Boyd, MRN: [id number]    Date: 07/28/22  Time Spent: 9:02 am - 9:59 am: 94 Minutes  Type of Service Provided Individual Therapy  Type of Contact in-person Location: office   Mental Status Exam: Appearance:  Neat and Well Groomed     Behavior: Sharing  Motor: Normal  Speech/Language:  Clear and Coherent  Affect: Appropriate  Mood: normal  Thought process: goal directed  Thought content:   WNL  Sensory/Perceptual disturbances:   WNL  Orientation: oriented to person, place, time/date, and situation  Attention: Good  Concentration: Good  Memory: WNL  Fund of knowledge:  Good  Insight:   Fair  Judgment:  Fair  Impulse Control: Good   Risk Assessment: No apparent indicator of HI or SI during the visit   Presenting Problems, Reported Symptoms, and /or Interim History: Nicole Boyd presented for a visit to discuss anxiety and life stress  Subjective: Nicole Boyd presented for an individual outpatient therapy session. The following was addressed during sessions.   Although she continues to experience a high level of stress, Nicole Boyd reported some improvement in the overall situation as her son has started school and has continued with in-home therapy.  He did, however, have a large meltdown between visits. Nicole Boyd was able to remain safe by removing herself from the situation and going outside until he calmed down. She reported that overall she is feeling hopeful about his situation. However, stressors continue and she expressed concerns about how to best support her partner at these times as well.  Using his school hours to try to engage in some coping activities for herself and potentially as an opportunities for relationship strengthening with her spouse were discussed.  She was encouraged to continue to put small breaks in her day to realign and replenish her coping resources .  The ongoing stressors and trauma she  is experiencing, and their impact on her mental and physical health were discussed along with the possibility that things may get a bit worse before they get better.  Interventions/Psychotherapy Techniques Used During Session: Cognitive Behavioral Therapy, Solution-Oriented/Positive Psychology, and Insight-Oriented  Diagnosis: Adjustment disorder with anxious mood  MENTAL HEALTH INTERVENTIONS USED DURING TREATMENT & PATIENT'S RESPONSE TO INTERVENTIONS:  Short-term Objective addressed today:Nicole Boyd will be able to replace ineffective or negative coping strategies with more adaptive strategies within 6 months. Mental health techniques used: Objective was addressed in session through the use of Cognitive Behavioral Therapy, Solution-Oriented/Positive Psychology, and Insight-Oriented and discussion. Lavera's response was positive.  Progress Toward Goal: Progressing -although the very high level of stress continues Henlee has shown some progress in engaging in self-care and coping activities  Short-term Objective addressed today:Nicole Boyd will be able to will try some of these communication strategies with her family members within 6 months. Mental health techniques used: Objective was addressed in session through the use of  and discussion. Katrine's response was positive -having a direct discussion with her spouse regarding what they can do to support each other was discussed and Laritza seemed interested in this option.  Progress Toward Goal: Progressing    PLAN  1. Nicole Boyd  will return for a therapy session.   2. Homework Given: Continue to put coping strategies into her day, talk directly with her spouse about things they can do to support each other if needed. This homework will be reviewed with Nicole Boyd at the next visit.  3. During the next session check in on stress level, anxiety, and  family situation  [name], [name]     Individual Treatment Plan - please see the notes from 3/28 for complete  information     Problem/Need: Anxiety / stress - Nicole Boyd reported a high level of anxiety and stress related to a situation with her son.    Long-Term Goal #1: Reduce level of general distress and anxiety to a more manageable level as evidenced by patient report   Short-Term Objectives: Objective 1A: Nicole Boyd will be able to identify 5 coping resources/activities within 3 months.  Objective 1B: Nicole Boyd will be able to identify ineffective or negative coping strategies within 3-5 months. Objective 1C: Nicole Boyd will be able to replace ineffective or negative coping strategies with more adaptive strategies within 6 months. Interventions: Cognitive Behavioral Therapy, Motivational Interviewing, and Psycho-education/Bibliotherapy  coping skills, and other evidenced-based practices will be used to promote progress towards healthy functioning and to help manage decrease symptoms associated with their diagnosis.  Treatment Regimen: Individual bi-weekly skill building sessions for assist with treatment goal/objective Target Date: 08/2022 Responsible Party: therapist and patient Person delivering treatment: Licensed Psychologist [name], [name] will support the patient's ability to achieve the goals identified. Resolved:  No  Problem/Need: Communication challenges - Nicole Boyd reported some negative communication with her family members.  Long-Term Goal #2: Nicole Boyd will develop effective communication strategies to use with her family members.  Short-Term Objectives: Objective 2A: Nicole Boyd will be able to identify communication breakdowns with her family members within 3 months.  Objective 2B: Nicole Boyd will be able to practice different communication strategies in session within 6 months. Objective 2C: Nicole Boyd will be able to will try some of these communication strategies with her family members within 6 months. Interventions: Cognitive Behavioral Therapy, Roleplay, and Motivational Interviewing  and other  evidenced-based practices will be used to promote progress towards healthy functioning and to help manage decrease symptoms associated with their diagnosis.  Treatment Regimen: Individual bi-weekly skill building sessions to work on treatment goal/objective Target Date: 08/2022 Responsible Party: therapist and patient Person delivering treatment: Licensed Psychologist [name], [name] will support the patient's ability to achieve the goals identified. Resolved:  No  [name], [name]

## 2022-08-13 ENCOUNTER — Ambulatory Visit (INDEPENDENT_AMBULATORY_CARE_PROVIDER_SITE_OTHER): Payer: 59 | Admitting: Clinical

## 2022-08-13 DIAGNOSIS — F4322 Adjustment disorder with anxiety: Secondary | ICD-10-CM

## 2022-08-13 NOTE — Progress Notes (Signed)
[facility] Counselor/Therapist Progress Note  Patient ID: Nicole Boyd, MRN: [id number]    Date: 08/13/22  Time Spent: 9:02 am - 9:58 am: 4 Minutes  Type of Service Provided Individual Therapy  Type of Contact in-person Location: office  Mental Status Exam: Appearance:  Casual and Well Groomed     Behavior: Appropriate  Motor: Normal  Speech/Language:  Clear and Coherent  Affect: Appropriate  Mood: normal  Thought process: normal  Thought content:   WNL  Sensory/Perceptual disturbances:   WNL  Orientation: oriented to person, place, time/date, and situation  Attention: Good  Concentration: Good  Memory: WNL  Fund of knowledge:  Good  Insight:   Fair  Judgment:  Fair  Impulse Control: Good   Risk Assessment: No apparent indicator of HI or SI during the visit   Presenting Problems, Reported Symptoms, and /or Interim History: Kamariyah presented for a visit to discuss life stress and anxiety  Subjective: Antonella presented for an individual outpatient therapy session. The following was addressed during sessions.   Belle reported that things have been variable since the last visit.  Her son has periodically attended school but has missed a number of days due to illness and not wanting to go.  School has asked the family to allow them space to work with him, and ways to manage this request was discussed.  The stress associated with always feeling as though they are "waiting for the other shoe to drop" was discussed.  Coping strategies for Teaira and her spouse were also discussed as well (e.g., trying to go for a walk together either first thing in the morning or in the evenings).   Interventions/Psychotherapy Techniques Used During Session: Cognitive Behavioral Therapy and Solution-Oriented/Positive Psychology  Diagnosis: Adjustment disorder with anxious mood  MENTAL HEALTH INTERVENTIONS USED DURING TREATMENT & PATIENT'S RESPONSE TO INTERVENTIONS:  Short-term  Objective addressed today:Hana will be able to replace ineffective or negative coping strategies with more adaptive strategies within 6 months. Mental health techniques used: Objective was addressed in session through the use of Cognitive Behavioral Therapy and Solution-Oriented/Positive Psychology and discussion. Kaylyne's response was mixed -although she has been engaging in more helpful coping strategies given the ongoing nature of the challenges that she is facing success has been variable.  Progress Toward Goal: Progressing     PLAN  1. Copelyn  will return for a therapy session.   2. Homework Given: Continue to try to implement coping strategies into the day, see if there is a way to incentivize regular school attendance to increase predictability for the family as a whole. This homework will be reviewed with [name] at the next visit.  3. During the next session check in on school, anxiety, coping skills, work stress.     [name], [name]    Individual Treatment Plan - please see the notes from 3/28 for complete information     Problem/Need: Anxiety / stress - Kashauna reported a high level of anxiety and stress related to a situation with her son.    Long-Term Goal #1: Reduce level of general distress and anxiety to a more manageable level as evidenced by patient report   Short-Term Objectives: Objective 1A: Akelia will be able to identify 5 coping resources/activities within 3 months.  Objective 1B: Alanny will be able to identify ineffective or negative coping strategies within 3-5 months. Objective 1C: Juline will be able to replace ineffective or negative coping strategies with more adaptive strategies within 6 months. Interventions: Cognitive Behavioral  Therapy, Motivational Interviewing, and Psycho-education/Bibliotherapy  coping skills, and other evidenced-based practices will be used to promote progress towards healthy functioning and to help manage decrease symptoms associated  with their diagnosis.  Treatment Regimen: Individual bi-weekly skill building sessions for assist with treatment goal/objective Target Date: 08/2022 Responsible Party: therapist and patient Person delivering treatment: Licensed Psychologist [name], [name] will support the patient's ability to achieve the goals identified. Resolved:  No  Problem/Need: Communication challenges - Ahna reported some negative communication with her family members.  Long-Term Goal #2: Aslin will develop effective communication strategies to use with her family members.  Short-Term Objectives: Objective 2A: Ramya will be able to identify communication breakdowns with her family members within 3 months.  Objective 2B: Sheyli will be able to practice different communication strategies in session within 6 months. Objective 2C: Marieme will be able to will try some of these communication strategies with her family members within 6 months. Interventions: Cognitive Behavioral Therapy, Roleplay, and Motivational Interviewing  and other evidenced-based practices will be used to promote progress towards healthy functioning and to help manage decrease symptoms associated with their diagnosis.  Treatment Regimen: Individual bi-weekly skill building sessions to work on treatment goal/objective Target Date: 08/2022 Responsible Party: therapist and patient Person delivering treatment: Licensed Psychologist [name], [name] will support the patient's ability to achieve the goals identified. Resolved:  No   [name], [name]

## 2022-08-27 ENCOUNTER — Ambulatory Visit (INDEPENDENT_AMBULATORY_CARE_PROVIDER_SITE_OTHER): Payer: BC Managed Care – PPO | Admitting: Clinical

## 2022-08-27 DIAGNOSIS — F4322 Adjustment disorder with anxiety: Secondary | ICD-10-CM

## 2022-08-27 NOTE — Progress Notes (Signed)
[facility] Counselor/Therapist Progress Note  Patient ID: Nicole Boyd, MRN: [id number]    Date: 08/27/22  Time Spent: 8:56 am - 9:57 am: 61 Minutes  Type of Service Provided Individual Therapy  Type of Contact in-person Location: office  Mental Status Exam: Appearance:  Neat and Well Groomed     Behavior: Sharing  Motor: Normal  Speech/Language:  Clear and Coherent and Normal Rate  Affect: Appropriate  Mood: normal but stressed   Thought process: normal  Thought content:   WNL  Sensory/Perceptual disturbances:   WNL  Orientation: oriented to person, place, time/date, and situation  Attention: Good  Concentration: Good  Memory: WNL  Fund of knowledge:  Good  Insight:   Fair  Judgment:  Fair  Impulse Control: Good   Risk Assessment: No apparent indicators of HI or SI during the visit   Presenting Problems, Reported Symptoms, and /or Interim History: Oakley presented for session to address life stress and anxiety.  Subjective: Ludie presented for an individual outpatient therapy session. The following was addressed during sessions.   Cassi reported that things were going "terrible".  She noted that her son has continued to struggle in school and has been showing some negative behaviors.  Her history of avoiding challenging, painful, anxiety provoking, or overwhelming topics was discussed along with where this may have originated from and how it is impacting her currently.  She was asked to think of times when she was able to overcome this avoidance and identified deadlines and accountability as factors that have helped her to not avoid stressful, overwhelming, anxiety provoking, or unpleasant tasks.  Her role as a peacemaker in her family and where this may have come from was also discussed.  Interventions/Psychotherapy Techniques Used During Session: Cognitive Behavioral Therapy, Solution-Oriented/Positive Psychology, and Insight-Oriented  Diagnosis:  Adjustment disorder with anxious mood  MENTAL HEALTH INTERVENTIONS USED DURING TREATMENT & PATIENT'S RESPONSE TO INTERVENTIONS:  Short-term Objective addressed today:Chava will be able to replace ineffective or negative coping strategies with more adaptive strategies within 6 months. Mental health techniques used: Objective was addressed in session through the use of Cognitive Behavioral Therapy, Solution-Oriented/Positive Psychology, and Insight-Oriented and discussion. Leoma's response was positive.  Progress Toward Goal: Progressing   PLAN  1. Jisele  will return for a therapy session.   2. Homework Given: Look for times when she was able to overcome her desire to avoid a task/conversation/activity and the factors that help to make that possible. This homework will be reviewed with [name] at the next visit.  3. During the next session discuss avoidance, life stress, anxiety.     [name], [name]    Individual Treatment Plan - please see the notes from 3/28 for complete information     Problem/Need: Anxiety / stress - Jeree reported a high level of anxiety and stress related to a situation with her son.    Long-Term Goal #1: Reduce level of general distress and anxiety to a more manageable level as evidenced by patient report   Short-Term Objectives: Objective 1A: Misha will be able to identify 5 coping resources/activities within 3 months.  Objective 1B: Lacora will be able to identify ineffective or negative coping strategies within 3-5 months. Objective 1C: Kelaiah will be able to replace ineffective or negative coping strategies with more adaptive strategies within 6 months. Interventions: Cognitive Behavioral Therapy, Motivational Interviewing, and Psycho-education/Bibliotherapy  coping skills, and other evidenced-based practices will be used to promote progress towards healthy functioning and to help manage decrease  symptoms associated with their diagnosis.  Treatment  Regimen: Individual bi-weekly skill building sessions for assist with treatment goal/objective Target Date: 08/2022 -target date moved to 12/23 Responsible Party: therapist and patient Person delivering treatment: Licensed Psychologist [name], [name] will support the patient's ability to achieve the goals identified. Resolved:  No - Beaux has made some progress with this goal but given the ongoing nature of the stressors that she is experiencing the goal has not fully been reached  Problem/Need: Communication challenges - Synda reported some negative communication with her family members.  Long-Term Goal #2: Karmela will develop effective communication strategies to use with her family members.  Short-Term Objectives: Objective 2A: Seraphine will be able to identify communication breakdowns with her family members within 3 months.  Objective 2B: Reighan will be able to practice different communication strategies in session within 6 months. Objective 2C: Adeja will be able to will try some of these communication strategies with her family members within 6 months. Interventions: Cognitive Behavioral Therapy, Roleplay, and Motivational Interviewing  and other evidenced-based practices will be used to promote progress towards healthy functioning and to help manage decrease symptoms associated with their diagnosis.  Treatment Regimen: Individual bi-weekly skill building sessions to work on treatment goal/objective Target Date: 08/2022 -target date moved to 12/23 Responsible Party: therapist and patient Person delivering treatment: Licensed Psychologist [name], [name] will support the patient's ability to achieve the goals identified. Resolved:  No - Jennier has also made progress on this school but again given the ongoing nature of the stressors and the high level of tension this goal has not fully been achieved   [name], [name]

## 2022-09-17 ENCOUNTER — Ambulatory Visit: Payer: BC Managed Care – PPO | Admitting: Clinical

## 2022-09-17 DIAGNOSIS — F4322 Adjustment disorder with anxiety: Secondary | ICD-10-CM

## 2022-09-17 NOTE — Progress Notes (Signed)
[facility] Counselor/Therapist Progress Note  Patient ID: Nicole Boyd, MRN: [id number]    Date: 09/17/22  Time Spent: 8:00 am - 8:45 am: 45 Minutes  Type of Service Provided Individual Therapy  Type of Contact in-person Location: office  Mental Status Exam: Appearance:  Neat and Well Groomed     Behavior: Sharing  Motor: Normal  Speech/Language:  Clear and Coherent  Affect: Restricted  Mood: normal  Thought process: normal  Thought content:   WNL  Sensory/Perceptual disturbances:   WNL  Orientation: oriented to person, place, time/date, and situation  Attention: Good  Concentration: Good  Memory: WNL  Fund of knowledge:  Fair  Insight:   Fair  Judgment:  Fair  Impulse Control: Good   Risk Assessment: no apparent indicators of SI or HI during visit   Presenting Problems, Reported Symptoms, and /or Interim History: Nicole Boyd presented for a session to address life stress  Subjective: Nicole Boyd presented for an individual outpatient therapy session. The following was addressed during sessions.   Nicole Boyd reported that things have continued to be very stressful at home and she is also experiencing stress at work.  Some decision she needs to make about her son were discussed with therapist encouraging Nicole Boyd to talk to some of his other providers.  Better utilizing some of her son's providers as well so she can offset some of her management of all aspects of his day was also discussed.  Ways that she can incorporate coping strategies and focus on small wins was also discussed. Nicole Boyd reported that overall she is feeling "tapped out" and overwhelmed and unsure of her next steps.  She expressed feeling negatively about these feelings. Therapist highlighted that this is a safe space for her to be able to talk about and process her feelings so that she is more equipped to manage on a day to day basis.   Interventions/Psychotherapy Techniques Used During Session: Cognitive  Behavioral Therapy  Diagnosis: Adjustment disorder with anxious mood  MENTAL HEALTH INTERVENTIONS USED DURING TREATMENT & PATIENT'S RESPONSE TO INTERVENTIONS:  Short-term Objective addressed today:Nicole Boyd will be able to replace ineffective or negative coping strategies with more adaptive strategies within 6 months. Mental health techniques used: Objective was addressed in session through the use of Cognitive Behavioral Therapy and discussion. Nicole Boyd response was mixed -although she has incorporated some coping activities into her daily routine she expressed feeling overwhelmed by adding other types of self care into her day.  Progress Toward Goal: Some progress     PLAN  1. Nicole Boyd  will return for a therapy session.   2. Homework Given: Try to better utilize some of her son's providers so that she can offload some of the responsibility for managing all of his stuff to them, look for small wins, try to spend 10 minutes a day focused on organizing one aspect of her life to try to reduce overall stress. This homework will be reviewed with Nicole Boyd the next visit.  3. During the next session check in on life stress, mood, and anxiety.     Nicole Chess, [name]     Individual Treatment Plan - please see the notes from 3/28 for complete information     Problem/Need: Anxiety / stress - Florentina reported a high level of anxiety and stress related to a situation with her son.    Long-Term Goal #1: Reduce level of general distress and anxiety to a more manageable level as evidenced by patient report   Short-Term Objectives: Objective 1A:  Corrina will be able to identify 5 coping resources/activities within 3 months.  Objective 1B: Chisom will be able to identify ineffective or negative coping strategies within 3-5 months. Objective 1C: Allan will be able to replace ineffective or negative coping strategies with more adaptive strategies within 6 months. Interventions: Cognitive Behavioral Therapy,  Motivational Interviewing, and Psycho-education/Bibliotherapy  coping skills, and other evidenced-based practices will be used to promote progress towards healthy functioning and to help manage decrease symptoms associated with their diagnosis.  Treatment Regimen: Individual bi-weekly skill building sessions for assist with treatment goal/objective Target Date: 08/2022 -target date moved to 12/23 Responsible Party: therapist and patient Person delivering treatment: Licensed Psychologist Nicole Chess, [name] will support the patient's ability to achieve the goals identified. Resolved:  No - Dmya has made some progress with this goal but given the ongoing nature of the stressors that she is experiencing the goal has not fully been reached  Problem/Need: Communication challenges - Guillermo reported some negative communication with her family members.  Long-Term Goal #2: Novah will develop effective communication strategies to use with her family members.  Short-Term Objectives: Objective 2A: Merridy will be able to identify communication breakdowns with her family members within 3 months.  Objective 2B: Miata will be able to practice different communication strategies in session within 6 months. Objective 2C: Zyion will be able to will try some of these communication strategies with her family members within 6 months. Interventions: Cognitive Behavioral Therapy, Roleplay, and Motivational Interviewing  and other evidenced-based practices will be used to promote progress towards healthy functioning and to help manage decrease symptoms associated with their diagnosis.  Treatment Regimen: Individual bi-weekly skill building sessions to work on treatment goal/objective Target Date: 08/2022 -target date moved to 12/23 Responsible Party: therapist and patient Person delivering treatment: Licensed Psychologist Nicole Chess, [name] will support the patient's ability to achieve the goals identified. Resolved:  No  - Stephonie has also made progress on this school but again given the ongoing nature of the stressors and the high level of tension this goal has not fully been achieved   Nicole Chess, [name]

## 2022-09-21 ENCOUNTER — Other Ambulatory Visit: Payer: Self-pay | Admitting: Internal Medicine

## 2022-09-21 DIAGNOSIS — Z1231 Encounter for screening mammogram for malignant neoplasm of breast: Secondary | ICD-10-CM

## 2022-10-01 ENCOUNTER — Ambulatory Visit: Payer: BC Managed Care – PPO | Admitting: Clinical

## 2022-10-01 DIAGNOSIS — F4322 Adjustment disorder with anxiety: Secondary | ICD-10-CM

## 2022-10-01 NOTE — Progress Notes (Signed)
[facility] Counselor/Therapist Progress Note  Patient ID: Orene Abbasi, MRN: [id number]    Date: 10/01/22  Time Spent: 1:06 pm - 2:03 pm: 82 Minutes  Type of Service Provided Individual Therapy  Type of Contact in-person Location: office  Mental Status Exam: Appearance:  Neat     Behavior: Sharing  Motor: Normal  Speech/Language:  Clear and Coherent and Normal Rate  Affect: Appropriate  Mood: normal  Thought process: normal  Thought content:   WNL  Sensory/Perceptual disturbances:   WNL  Orientation: oriented to person, place, time/date, and situation  Attention: Good  Concentration: Good  Memory: WNL  Fund of knowledge:  Good  Insight:   Fair  Judgment:  Fair  Impulse Control: Good   Risk Assessment: No apparent indicators of HI or SI during the visit  Presenting Problems, Reported Symptoms, and /or Interim History: Stepahnie presented for a visit to discuss life stress and anxiety  Subjective: Nidia presented for an individual outpatient therapy session. The following was addressed during sessions.   Javeah reported that there have been ongoing stressors associated with her family situation.  However her family has met with her child school and has crafted a plan to try to help him be more successful in that environment.  She and her husband have also created a plan for him at home.  Different strategies that she may use to help execute and clarify these plans were discussed.  Stressors associated with work were discussed as there seemed to be some personal changes occurring. Kaylianna discussed how avoidance and procrastination have sometimes impacted her performance in this environment and strategies that she is using or times when she has been able to overcome these were discussed.  Interventions/Psychotherapy Techniques Used During Session: Cognitive Behavioral Therapy, Assertiveness/Communication, and Solution-Oriented/Positive Psychology  Diagnosis:  Adjustment disorder with anxious mood  MENTAL HEALTH INTERVENTIONS USED DURING TREATMENT & PATIENT'S RESPONSE TO INTERVENTIONS:  Short-term Objective addressed today:Lakeeta will be able to will try some of these communication strategies with her family members within 6 months. Mental health techniques used: Objective was addressed in session through the use of Assertiveness/Communication and discussion. Arnetha's response was positive.  Progress Toward Goal: Some progress  Short-term Objective addressed today:Shanan will be able to replace ineffective or negative coping strategies with more adaptive strategies within 6 months. Mental health techniques used: Objective was addressed in session through the use of Cognitive Behavioral Therapy and Solution-Oriented/Positive Psychology and discussion. Kynleigh's response was positive.  Progress Toward Goal: Some progress    PLAN  1. Monita will return for a therapy session.   2. Homework Given: Try to implement the plans with her son and make adjustments as needed, continue to look for opportunities for space to engage in coping activities. This homework will be reviewed with [name] at the next visit.  3. During the next session check in on mood and anxiety and family situation.     [name], [name]     Individual Treatment Plan - please see the notes from 3/28 for complete information     Problem/Need: Anxiety / stress - Delois reported a high level of anxiety and stress related to a situation with her son.    Long-Term Goal #1: Reduce level of general distress and anxiety to a more manageable level as evidenced by patient report   Short-Term Objectives: Objective 1A: Ayat will be able to identify 5 coping resources/activities within 3 months.  Objective 1B: Gabryella will be able to identify ineffective or  negative coping strategies within 3-5 months. Objective 1C: Rianne will be able to replace ineffective or negative coping strategies with  more adaptive strategies within 6 months. Interventions: Cognitive Behavioral Therapy, Motivational Interviewing, and Psycho-education/Bibliotherapy  coping skills, and other evidenced-based practices will be used to promote progress towards healthy functioning and to help manage decrease symptoms associated with their diagnosis.  Treatment Regimen: Individual bi-weekly skill building sessions for assist with treatment goal/objective Target Date: 08/2022 -target date moved to 12/23 Responsible Party: therapist and patient Person delivering treatment: Licensed Psychologist [name], [name] will support the patient's ability to achieve the goals identified. Resolved:  No - Kielee has made some progress with this goal but given the ongoing nature of the stressors that she is experiencing the goal has not fully been reached  Problem/Need: Communication challenges - Hazley reported some negative communication with her family members.  Long-Term Goal #2: Monik will develop effective communication strategies to use with her family members.  Short-Term Objectives: Objective 2A: Tinley will be able to identify communication breakdowns with her family members within 3 months.  Objective 2B: Leler will be able to practice different communication strategies in session within 6 months. Objective 2C: Kynzlie will be able to will try some of these communication strategies with her family members within 6 months. Interventions: Cognitive Behavioral Therapy, Roleplay, and Motivational Interviewing  and other evidenced-based practices will be used to promote progress towards healthy functioning and to help manage decrease symptoms associated with their diagnosis.  Treatment Regimen: Individual bi-weekly skill building sessions to work on treatment goal/objective Target Date: 08/2022 -target date moved to 12/23 Responsible Party: therapist and patient Person delivering treatment: Licensed Psychologist [name],  [name] will support the patient's ability to achieve the goals identified. Resolved:  No - Addalyne has also made progress on this school but again given the ongoing nature of the stressors and the high level of tension this goal has not fully been achieved  [name], [name]

## 2022-10-02 ENCOUNTER — Ambulatory Visit
Admission: RE | Admit: 2022-10-02 | Discharge: 2022-10-02 | Disposition: A | Discharge status: Home / Self Care | Payer: Self-pay | Source: Ambulatory Visit | Attending: Internal Medicine | Admitting: Internal Medicine

## 2022-10-02 DIAGNOSIS — Z1231 Encounter for screening mammogram for malignant neoplasm of breast: Secondary | ICD-10-CM

## 2022-10-21 ENCOUNTER — Ambulatory Visit: Payer: BC Managed Care – PPO | Admitting: Clinical

## 2022-10-21 DIAGNOSIS — F4322 Adjustment disorder with anxiety: Secondary | ICD-10-CM | POA: Diagnosis not present

## 2022-10-21 NOTE — Progress Notes (Signed)
[facility] Counselor/Therapist Progress Note  Patient ID: Lynette Topete, MRN: [id number]    Date: 10/21/22  Time Spent: 8:05 am - 9:00 am: 44 Minutes  Type of Service Provided Individual Therapy  Type of Contact in-person Location: office   Mental Status Exam: Appearance:  Neat     Behavior: Appropriate and at times a bit minimizing   Motor: Normal  Speech/Language:  Clear and Coherent  Affect: Appropriate  Mood: normal  Thought process: normal  Thought content:   WNL  Sensory/Perceptual disturbances:   WNL  Orientation: oriented to person, place, time/date, and situation  Attention: Good  Concentration: Good  Memory: WNL  Fund of knowledge:  Good  Insight:   Fair  Judgment:  Fair  Impulse Control: Good   Risk Assessment: No apparent indicators of SI or HI during the visit   Presenting Problems, Reported Symptoms, and /or Interim History: Terrionna presented for a visit focused on life stress and anxiety  Subjective: Jendaya presented for an individual outpatient therapy session. The following was addressed during sessions.   Zanyla indicated that some things at home have been better and some things at home have been worse.  Several stressors that have occurred over the last several weeks were discussed with therapist providing some information about additional resources for the family. Payson was also encouraged to talk to her son's provider about additional resources.  Work stress was also discussed. Kamariya indicated that as one of her supervisors has been leaving she has realized some of the challenges that occurred within that relationship.  The process by which this has become clear to her now whereas in the past it was not was discussed along with ways that she can carve out some time to think about how to secure and expand her role at work.  Strategies that may be helpful for the entire family to slightly decrease stress were also  discussed.  Interventions/Psychotherapy Techniques Used During Session: Cognitive Behavioral Therapy and Solution-Oriented/Positive Psychology  Diagnosis: Adjustment disorder with anxious mood  MENTAL HEALTH INTERVENTIONS USED DURING TREATMENT & PATIENT'S RESPONSE TO INTERVENTIONS:  Short-term Objective addressed today:Mishell will be able to replace ineffective or negative coping strategies with more adaptive strategies . Mental health techniques used: Objective was addressed in session through the use of  Cognitive Behavioral Therapy and Solution-Oriented/Positive Psychology and discussion. Ceonna's response was positive.  Progress Toward Goal: some progress   PLAN  1. Tish will return for a therapy session.   2. Homework Given: Spend 15 minutes a week thinking about work tasks and which ones could be delegated and if there are any that she has been avoiding due to anxiety and make a plan to address those, determine where she can expand her role. This homework will be reviewed with [name] at the next visit.  3. During the next session check in on anxiety, work stress, home stress.     [name], [name]    Individual Treatment Plan - please see the notes from 3/28 for complete information     Problem/Need: Anxiety / stress - Senie reported a high level of anxiety and stress related to a situation with her son.    Long-Term Goal #1: Reduce level of general distress and anxiety to a more manageable level as evidenced by patient report   Short-Term Objectives: Objective 1A: Jordie will be able to identify 5 coping resources/activities.  Objective 1B: Xitlally will be able to identify ineffective or negative coping strategies. Objective 1C: Makenzye will  be able to replace ineffective or negative coping strategies with more adaptive strategies. Interventions: Cognitive Behavioral Therapy, Motivational Interviewing, and Psycho-education/Bibliotherapy  coping skills, and other  evidenced-based practices will be used to promote progress towards healthy functioning and to help manage decrease symptoms associated with their diagnosis.  Treatment Regimen: Individual bi-weekly skill building sessions for assist with treatment goal/objective Target Date: 12/23 Responsible Party: therapist and patient Person delivering treatment: Licensed Psychologist [name], [name] will support the patient's ability to achieve the goals identified. Resolved:  No - Jamesia has made some progress with this goal but given the ongoing nature of the stressors that she is experiencing the goal has not fully been reached  Problem/Need: Communication challenges - Katya reported some negative communication with her family members.  Long-Term Goal #2: Lashannon will develop effective communication strategies to use with her family members.  Short-Term Objectives: Objective 2A: Midge will be able to identify communication breakdowns with her family members .  Objective 2B: Ashleah will be able to practice different communication strategies in session . Objective 2C: Harriette will be able to will try some of these communication strategies with her family members. Interventions: Cognitive Behavioral Therapy, Roleplay, and Motivational Interviewing  and other evidenced-based practices will be used to promote progress towards healthy functioning and to help manage decrease symptoms associated with their diagnosis.  Treatment Regimen: Individual bi-weekly skill building sessions to work on treatment goal/objective Target Date: 12/23 Responsible Party: therapist and patient Person delivering treatment: Licensed Psychologist [name], [name] will support the patient's ability to achieve the goals identified. Resolved:  No - Jeaninne has also made progress on this school but again given the ongoing nature of the stressors and the high level of tension this goal has not fully been achieved  [name], [name]

## 2022-11-04 ENCOUNTER — Ambulatory Visit: Payer: BC Managed Care – PPO | Admitting: Clinical

## 2022-11-04 DIAGNOSIS — F4322 Adjustment disorder with anxiety: Secondary | ICD-10-CM | POA: Diagnosis not present

## 2022-11-04 NOTE — Progress Notes (Signed)
[facility] Counselor/Therapist Progress Note  Patient ID: Nicole Boyd, MRN: [id number]    Date: 11/04/22  Time Spent: 8:00 am - 9:00 am: 60 Minutes  Type of Service Provided Individual Therapy  Type of Contact in-person  Location: office  Mental Status Exam: Appearance:  Neat and Well Groomed     Behavior: Appropriate and Sharing  Motor: Normal  Speech/Language:  Clear and Coherent and Normal Rate  Affect: Appropriate  Mood: normal  Thought process: normal  Thought content:   WNL  Sensory/Perceptual disturbances:   WNL  Orientation: oriented to person, place, time/date, situation, and day of week  Attention: Good  Concentration: Good  Memory: WNL  Fund of knowledge:  Fair  Insight:   Fair  Judgment:  Good  Impulse Control: Good   Risk Assessment: no apparent indicators of HI or SI during visit   Presenting Problems, Reported Symptoms, and /or Interim History: Nicole Boyd presented for a visit to discuss life stress  Subjective: Nicole Boyd presented for an individual outpatient therapy session. The following was addressed during sessions.   Nicole Boyd reported that her son has made some progress in attending school and calming down more quickly after outbursts.  Some stressful situations with the school were discussed.  Several stressors at work were discussed with therapist encouraging Nicole Boyd to continue to think about how to best carve out her role.  Given the high levels of stress both at home and at work, discussed coping strategies to try to help reduce some of the stress.  Thinking of ways to engage in more active strategies and pushing herself slightly outside of her comfort zone with tasks she may avoid were discussed.  Discussed upcoming holidays, boundary setting, and looking for ways to encourage even brief brief moments of respite during potentially stressful experiences.  Interventions/Psychotherapy Techniques Used During Session: Cognitive Behavioral Therapy  and Solution-Oriented/Positive Psychology  Diagnosis: Adjustment disorder with anxious mood  MENTAL HEALTH INTERVENTIONS USED DURING TREATMENT & PATIENT'S RESPONSE TO INTERVENTIONS:  Short-term Objective addressed today: Nicole Boyd will be able to replace ineffective or negative coping strategies with more adaptive strategies. Mental health techniques used: Objective was addressed in session through the use of Cognitive Behavioral Therapy and Solution-Oriented/Positive Psychology and discussion. Nicole Boyd's response was generally positive.  Progress Toward Goal: Progressing    PLAN  1. Nicole Boyd  will return for a therapy session.   2. Homework Given: Continue to incorporate stress relieving activities into her daily and weekly life, look for opportunities for relaxation or bright spots during any potentially stressful events.. This homework will be reviewed with Nicole Boyd at the next visit.  3. During the next session check in on stress and anxiety   [name], [name]   [place] - please see the notes from 3/28 for complete information     Problem/Need: Anxiety / stress - Nicole Boyd reported a high level of anxiety and stress related to a situation with her son.    Long-Term Goal #1: Reduce level of general distress and anxiety to a more manageable level as evidenced by patient report   Short-Term Objectives: Objective 1A: Nicole Boyd will be able to identify 5 coping resources/activities.  Objective 1B: Nicole Boyd will be able to identify ineffective or negative coping strategies. Objective 1C: Nicole Boyd will be able to replace ineffective or negative coping strategies with more adaptive strategies. Interventions: Cognitive Behavioral Therapy, Motivational Interviewing, and Psycho-education/Bibliotherapy  coping skills, and other evidenced-based practices will be used to promote progress towards healthy functioning and to help  manage decrease symptoms associated with their diagnosis.  Treatment  Regimen: Individual bi-weekly skill building sessions for assist with treatment goal/objective Target Date: 12/23 Responsible Party: therapist and patient Person delivering treatment: Licensed Psychologist [name], [name] will support the patient's ability to achieve the goals identified. Resolved:  No - Nicole Boyd has made some progress with this goal but given the ongoing nature of the stressors that she is experiencing the goal has not fully been reached  Problem/Need: Communication challenges - Nicole Boyd reported some negative communication with her family members.  Long-Term Goal #2: Nicole Boyd will develop effective communication strategies to use with her family members.  Short-Term Objectives: Objective 2A: Nicole Boyd will be able to identify communication breakdowns with her family members .  Objective 2B: Nicole Boyd will be able to practice different communication strategies in session . Objective 2C: Nicole Boyd will be able to will try some of these communication strategies with her family members. Interventions: Cognitive Behavioral Therapy, Roleplay, and Motivational Interviewing  and other evidenced-based practices will be used to promote progress towards healthy functioning and to help manage decrease symptoms associated with their diagnosis.  Treatment Regimen: Individual bi-weekly skill building sessions to work on treatment goal/objective Target Date: 12/23 Responsible Party: therapist and patient Person delivering treatment: Licensed Psychologist [name], [name] will support the patient's ability to achieve the goals identified. Resolved:  No - Nicole Boyd has also made progress on this school but again given the ongoing nature of the stressors and the high level of tension this goal has not fully been achieved   [name], [name]

## 2022-11-26 ENCOUNTER — Ambulatory Visit: Payer: BC Managed Care – PPO | Admitting: Clinical

## 2022-11-26 DIAGNOSIS — F4322 Adjustment disorder with anxiety: Secondary | ICD-10-CM

## 2022-11-26 NOTE — Progress Notes (Signed)
[facility] Counselor/Therapist Progress Note  Patient ID: Nicole Boyd, MRN: [id number]    Date: 11/26/22  Time Spent: 8:05 am - 9:01 am:  51 Minutes  Type of Service Provided Individual Therapy  Type of Contact in-person  Location: office  Mental Status Exam: Appearance:  Neat     Behavior: Sharing  Motor: Normal  Speech/Language:  Clear and Coherent  Affect: Appropriate  Mood: normal  Thought process: normal  Thought content:   WNL  Sensory/Perceptual disturbances:   WNL  Orientation: oriented to person, place, time/date, and situation  Attention: Good  Concentration: Good  Memory: WNL  Fund of knowledge:  Good  Insight:   Fair to good  Judgment:  Fair to good   Impulse Control: Good   Risk Assessment: no apparent indicators of SI or HI during visit  Presenting Problems, Reported Symptoms, and /or Interim History: Nicole Boyd presented for a visit to address life stress and anxiety.   Subjective: Nicole Boyd presented for an individual outpatient therapy session. The following was addressed during sessions.   Nicole Boyd reported that overall her holiday was good.  Several at home stressors were discussed and strategized around.  Current work stress was also discussed as well as the opportunity that Nicole Boyd has to further carve out her role and demonstrate growth in her work position.  Some anxious thinking surrounding others perceptions of her at work and her tendency to avoid tasks that she finds overwhelming was discussed, along with some problem solving around structural changes that may help her be more able to begin the tasks that she avoids.  Interventions/Psychotherapy Techniques Used During Session: Cognitive Behavioral Therapy and Solution-Oriented/Positive Psychology  Diagnosis: Adjustment disorder with anxious mood  MENTAL HEALTH INTERVENTIONS USED DURING TREATMENT & PATIENT'S RESPONSE TO INTERVENTIONS:  Short-term Objective addressed today: Nicole Boyd will be  able to replace ineffective or negative coping strategies with more adaptive strategies. Mental health techniques used: Objective was addressed in session through the use of Cognitive Behavioral Therapy and discussion. Nicole Boyd's response was positive.  Progress Toward Goal: Progressing     PLAN  1. Nicole Boyd will return for a therapy session.   2. 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 This homework will be reviewed with Nicole Boyd at the next visit.  3. During the next session check in on work stress, home stress and anxiety and mood.     Nicole Chess, [name]  Individual Treatment Plan - please see the notes from 3/28 for complete information     Problem/Need: Anxiety / stress - Nicole Boyd reported a high level of anxiety and stress related to a situation with her son.    Long-Term Goal #1: Reduce level of general distress and anxiety to a more manageable level as evidenced by patient report   Short-Term Objectives: Objective 1A: Nicole Boyd will be able to identify 5 coping resources/activities.  Objective 1B: Nicole Boyd will be able to identify ineffective or negative coping strategies. Objective 1C: Nicole Boyd will be able to replace ineffective or negative coping strategies with more adaptive strategies. Interventions: Cognitive Behavioral Therapy, Motivational Interviewing, and Psycho-education/Bibliotherapy  coping skills, and other evidenced-based practices will be used to promote progress towards healthy functioning and to help manage decrease symptoms associated with their diagnosis.  Treatment Regimen: Individual bi-weekly skill building sessions for assist with treatment goal/objective Target Date: 12/23 - target dated updated to  3/24 Responsible Party: therapist and patient Person delivering treatment: Licensed Psychologist Nicole Chess, [name] will support the  patient's ability to achieve the goals identified. Resolved:  No - Nicole Boyd has made some progress with this goal but given the ongoing nature of the stressors that she is experiencing the goal has not fully been reached  Problem/Need: Communication challenges - Nicole Boyd reported some negative communication with her family members.  Long-Term Goal #2: Nicole Boyd will develop effective communication strategies to use with her family members.  Short-Term Objectives: Objective 2A: Nicole Boyd will be able to identify communication breakdowns with her family members .  Objective 2B: Nicole Boyd will be able to practice different communication strategies in session . Objective 2C: Nicole Boyd will be able to will try some of these communication strategies with her family members. Interventions: Cognitive Behavioral Therapy, Roleplay, and Motivational Interviewing  and other evidenced-based practices will be used to promote progress towards healthy functioning and to help manage decrease symptoms associated with their diagnosis.  Treatment Regimen: Individual bi-weekly skill building sessions to work on treatment goal/objective Target Date: 12/23 - target date updated to 3/24 Responsible Party: therapist and patient Person delivering treatment: Licensed Psychologist Nicole Chess, [name] will support the patient's ability to achieve the goals identified. Resolved:  No - Nicole Boyd has also made progress on this goal but again given the ongoing nature of the stressors and the high level of tension this goal has not fully been achieved  Nicole Chess, [name]

## 2022-12-07 ENCOUNTER — Ambulatory Visit: Payer: BC Managed Care – PPO | Admitting: Clinical

## 2022-12-07 DIAGNOSIS — F4322 Adjustment disorder with anxiety: Secondary | ICD-10-CM | POA: Diagnosis not present

## 2022-12-07 NOTE — Progress Notes (Signed)
[facility] Counselor/Therapist Progress Note  Patient ID: Nicole Boyd, MRN: [id number]    Date: 12/07/22  Time Spent: 8:00 am - 9:00 am: 60 Minutes  Type of Service Provided Individual Therapy  Type of Contact in-person Location: office   Mental Status Exam: Appearance:  Neat     Behavior: Appropriate and Sharing  Motor: Normal  Speech/Language:  Clear and Coherent  Affect: Appropriate  Mood: normal  Thought process: normal  Thought content:   WNL  Sensory/Perceptual disturbances:   WNL  Orientation: oriented to person, place, time/date, and situation  Attention: Good  Concentration: Good  Memory: WNL  Fund of knowledge:  Good  Insight:   Good  Judgment:  Good  Impulse Control: Good   Risk Assessment: No apparent indicators of SI or HI during the visit  Presenting Problems, Reported Symptoms, and /or Interim History: Nicole Boyd presented for a session to discuss life stress  Subjective: Nicole Boyd presented for an individual outpatient therapy session. The following was addressed during sessions.   Nicole Boyd reported that she continues to experience stress at home due to challenges with her son's mood and husband's feelings about having to manage him.  She also has experienced ongoing work stress.  However, she noted that recently she has been able to be more assertive and stand up for herself at work.  How these new skills could be applied both at home and at work were discussed.  Her previous reluctance to engage in these behaviors was explored including fears of rejection and avoidance.  Strategies that she could use in the future were discussed, including looking not only at incidents where she could have done something differently and the outcome was not ideal but also looking at instances when things went well.  Strategies to continue to make progress with work and home tasks were discussed.  Interventions/Psychotherapy Techniques Used During Session: Cognitive  Behavioral Therapy, Assertiveness/Communication, Solution-Oriented/Positive Psychology, and Insight-Oriented  Diagnosis: Adjustment disorder with anxious mood  MENTAL HEALTH INTERVENTIONS USED DURING TREATMENT & PATIENT'S RESPONSE TO INTERVENTIONS:  Short-term Objective addressed today: Nicole Boyd will be able to replace ineffective or negative coping strategies with more adaptive strategies. Mental health techniques used: Objective was addressed in session through the use of Cognitive Behavioral Therapy, Solution-Oriented/Positive Psychology, and Insight-Oriented and discussion. Nicole Boyd's response was positive.  Progress Toward Goal: progressing  Short-term Objective addressed today:Nicole Boyd will be able to practice different communication strategies in session. AND Nicole Boyd will be able to will try some of these communication strategies with her family members. Mental health techniques used: Objective was addressed in session through the use of Cognitive Behavioral Therapy and Assertiveness/Communication and discussion. Nicole Boyd's response was positive.  Progress Toward Goal: Progressing    PLAN  1. Nicole Boyd will return for a therapy session.   2. Homework Given: Carve out space to set an agenda for upcoming work tasks, continue to practice setting good boundaries and communication. This homework will be reviewed with Nicole Boyd at the next visit.  3. During the next session check in on life stress, mood, anxiety.     [name], [name] Individual Treatment Plan - please see the notes from 3/28 for complete information     Problem/Need: Anxiety / stress - Nicole Boyd reported a high level of anxiety and stress related to a situation with her son.    Long-Term Goal #1: Reduce level of general distress and anxiety to a more manageable level as evidenced by patient report   Short-Term Objectives: Objective 1A: Nicole Boyd will be  able to identify 5 coping resources/activities.  Objective 1B: Nicole Boyd will be able to  identify ineffective or negative coping strategies. Objective 1C: Nicole Boyd will be able to replace ineffective or negative coping strategies with more adaptive strategies. Interventions: Cognitive Behavioral Therapy, Motivational Interviewing, and Psycho-education/Bibliotherapy  coping skills, and other evidenced-based practices will be used to promote progress towards healthy functioning and to help manage decrease symptoms associated with their diagnosis.  Treatment Regimen: Individual bi-weekly skill building sessions for assist with treatment goal/objective Target Date: 12/23 - target dated updated to 3/24 Responsible Party: therapist and patient Person delivering treatment: Licensed Psychologist [name], [name] will support the patient's ability to achieve the goals identified. Resolved:  No - Ernesta has made some progress with this goal but given the ongoing nature of the stressors that she is experiencing the goal has not fully been reached  Problem/Need: Communication challenges - Nicole Boyd reported some negative communication with her family members.  Long-Term Goal #2: Nicole Boyd will develop effective communication strategies to use with her family members.  Short-Term Objectives: Objective 2A: Nicole Boyd will be able to identify communication breakdowns with her family members .  Objective 2B: Nicole Boyd will be able to practice different communication strategies in session . Objective 2C: Nicole Boyd will be able to will try some of these communication strategies with her family members. Interventions: Cognitive Behavioral Therapy, Roleplay, and Motivational Interviewing  and other evidenced-based practices will be used to promote progress towards healthy functioning and to help manage decrease symptoms associated with their diagnosis.  Treatment Regimen: Individual bi-weekly skill building sessions to work on treatment goal/objective Target Date: 12/23 - target date updated to 3/24 Responsible Party:  therapist and patient Person delivering treatment: Licensed Psychologist [name], [name] will support the patient's ability to achieve the goals identified. Resolved:  No - Nicole Boyd has also made progress on this goal but again given the ongoing nature of the stressors and the high level of tension this goal has not fully been achieved    [name], [name]

## 2022-12-24 ENCOUNTER — Ambulatory Visit: Payer: BC Managed Care – PPO | Admitting: Clinical

## 2022-12-24 DIAGNOSIS — F4322 Adjustment disorder with anxiety: Secondary | ICD-10-CM | POA: Diagnosis not present

## 2022-12-24 NOTE — Progress Notes (Signed)
[facility] Counselor/Therapist Progress Note  Patient ID: Nicole Boyd, MRN: [id number]    Date: 12/24/22  Time Spent: 8:04 am - 9:01 am: 53 Minutes  Type of Service Provided Individual Therapy  Type of Contact in-person Location: office  Mental Status Exam: Appearance:  Neat and Well Groomed     Behavior: Appropriate and Sharing  Motor: Normal  Speech/Language:  Clear and Coherent and Normal Rate  Affect: Appropriate  Mood: normal  Thought process: normal  Thought content:   WNL  Sensory/Perceptual disturbances:   WNL  Orientation: oriented to person, place, time/date, and situation  Attention: Good  Concentration: Good  Memory: WNL  Fund of knowledge:  Good  Insight:   Fair  Judgment:  Good  Impulse Control: Good   Risk Assessment: No apparent indicators of SI or HI during the visit  Presenting Problems, Reported Symptoms, and /or Interim History: Nicole Boyd presented for a session to address life stress.  Subjective: Nicole Boyd presented for an individual outpatient therapy session. The following was addressed during sessions.   Nicole Boyd reported that there were ups and downs to her holidays.  Her son had a particularly rough patch at the beginning and she used very clear communication with him which although he was not responsive to in the moment seem to make a difference for some of the remainder of the break.  He had a rough transition back to school however and stressors associated with this were discussed.  She has continued to become more aware of challenges that have occurred around her at work and examine how she was not fully aware of these or may have avoided thinking about them at the time and how to move forward.  Interventions/Psychotherapy Techniques Used During Session: Cognitive Behavioral Therapy, Assertiveness/Communication, and Solution-Oriented/Positive Psychology  Diagnosis: Adjustment disorder with anxious mood  MENTAL HEALTH INTERVENTIONS USED  DURING TREATMENT & PATIENT'S RESPONSE TO INTERVENTIONS:  Short-term Objective addressed today:Nicole Boyd will be able to replace ineffective or negative coping strategies with more adaptive strategies. Mental health techniques used: Objective was addressed in session through the use of  Cognitive Behavioral Therapy and Solution-Oriented/Positive Psychology and discussion. Nicole Boyd's  response was positive.  Progress Toward Goal: Progressing  Short-term Objective addressed today:Nicole Boyd will be able to will try some of these communication strategies with her family members. Mental health techniques used: Objective was addressed in session through the use of Cognitive Behavioral Therapy and Assertiveness/Communication and discussion. Nicole Boyd's response was positive.  Progress Toward Goal: Progressing    PLAN  1. Nicole Boyd will return for a therapy session.   2. Homework Given: Continue to be more communicative about her role and input at work, continue to look for opportunities to provide clear direct communication with her family and stress management when able. This homework will be reviewed with Nicole Boyd at the next visit.  3. During the next session check in on life stress mood and anxiety.     [name], [name] Individual Treatment Plan - please see the notes from 3/28 for complete information     Problem/Need: Anxiety / stress - Nicole Boyd reported a high level of anxiety and stress related to a situation with her son.    Long-Term Goal #1: Reduce level of general distress and anxiety to a more manageable level as evidenced by patient report   Short-Term Objectives: Objective 1A: Nicole Boyd will be able to identify 5 coping resources/activities.  Objective 1B: Nicole Boyd will be able to identify ineffective or negative coping strategies. Objective 1C: Nicole Boyd will  be able to replace ineffective or negative coping strategies with more adaptive strategies. Interventions: Cognitive Behavioral Therapy, Motivational  Interviewing, and Psycho-education/Bibliotherapy  coping skills, and other evidenced-based practices will be used to promote progress towards healthy functioning and to help manage decrease symptoms associated with their diagnosis.  Treatment Regimen: Individual bi-weekly skill building sessions for assist with treatment goal/objective Target Date: 3/24 Responsible Party: therapist and patient Person delivering treatment: Licensed Psychologist [name], [name] will support the patient's ability to achieve the goals identified. Resolved:  No -as of January 2024 Nicole Boyd has made significant progress in recognizing ineffective coping strategies but, as noted previously, given the ongoing nature of the stressors that she is experiencing the goal has not fully been reached  Problem/Need: Communication challenges - Nicole Boyd reported some negative communication with her family members.  Long-Term Goal #2: Nicole Boyd will develop effective communication strategies to use with her family members.  Short-Term Objectives: Objective 2A: Nicole Boyd will be able to identify communication breakdowns with her family members .  Objective 2B: Nicole Boyd will be able to practice different communication strategies in session . Objective 2C: Nicole Boyd will be able to will try some of these communication strategies with her family members. Interventions: Cognitive Behavioral Therapy, Roleplay, and Motivational Interviewing  and other evidenced-based practices will be used to promote progress towards healthy functioning and to help manage decrease symptoms associated with their diagnosis.  Treatment Regimen: Individual bi-weekly skill building sessions to work on treatment goal/objective Target Date: 3/24 Responsible Party: therapist and patient Person delivering treatment: Licensed Psychologist [name], [name] will support the patient's ability to achieve the goals identified. Resolved:  No - Nicole Boyd has also made progress on this  goal but again given the ongoing nature of the stressors and the high level of tension this goal has not fully been achieved   [name], [name]

## 2023-01-12 ENCOUNTER — Ambulatory Visit: Payer: BC Managed Care – PPO | Admitting: Clinical

## 2023-01-12 DIAGNOSIS — F4322 Adjustment disorder with anxiety: Secondary | ICD-10-CM | POA: Diagnosis not present

## 2023-01-12 NOTE — Progress Notes (Signed)
[facility] Counselor/Therapist Progress Note  Patient ID: Nicole Boyd, MRN: [id number]    Date: 01/12/23  Time Spent: 8:07 am - 9:01 am: 16 Minutes  Type of Service Provided Individual Therapy  Type of Contact in-person Location: office  Mental Status Exam: Appearance:  Neat and Well Groomed     Behavior: Appropriate and open   Motor: Normal  Speech/Language:  Clear and Coherent and Normal Rate  Affect: Appropriate  Mood: normal and a bit overwhelmed   Thought process: normal  Thought content:   WNL  Sensory/Perceptual disturbances:   WNL  Orientation: oriented to person, place, time/date, and situation  Attention: Good  Concentration: Good  Memory: WNL  Fund of knowledge:  Good  Insight:   Fair to good  Judgment:  Fair to good  Impulse Control: Good   Risk Assessment: No apparent indicators of SI or HI during the visit  Presenting Problems, Reported Symptoms, and /or Interim History: Callahan presented for a session to address life stress  Subjective: Ngan presented for an individual outpatient therapy session. The following was addressed during sessions.   Aquanetta reported that things have been challenging with her son. Some issues he is having at school and at home were discussed and some problem solving around these issues was completed.  Nohea was encouraged to think about realistic goals for him and to work with his school team and providers to set these goals. Therapist and Zeynab worked together to identify some additional potential resources. She expressed some tearfulness in the last week or so after the loss of a family friend. However, she expressed feeling a bit better after being able to express these emotions. Continued strategies for stress management in the context of an overwhelming and stressful experience were discussed.   Interventions/Psychotherapy Techniques Used During Session: Cognitive Behavioral Therapy, Assertiveness/Communication,  and Solution-Oriented/Positive Psychology  Diagnosis: Adjustment disorder with anxious mood  MENTAL HEALTH INTERVENTIONS USED DURING TREATMENT & PATIENT'S RESPONSE TO INTERVENTIONS:  Short-term Objective addressed today:Lakesha will be able to identify ineffective or negative coping strategies. AND Jezlyn will be able to replace ineffective or negative coping strategies with more adaptive strategies. Mental health techniques used: Objective was addressed in session through the use of Cognitive Behavioral Therapy and Solution-Oriented/Positive Psychology and discussion. Leetta's response was generally positive.  Progress Toward Goal: some progress but given ongoing stressors it is challenging.   Short-term Objective addressed today:Natilie will be able to will try some of these communication strategies with her family members. Mental health techniques used: Objective was addressed in session through the use of Solution-Oriented/Positive Psychology and discussion. Rosaelena's response was somewhat positive.  Progress Toward Goal: some progress     PLAN  1. Tyshauna will return for a therapy session.   2. Homework Given:  talk about realistic goals and how to make use of the rest of this year, practice relaxation when possible. This homework will be reviewed with [name] at the next visit.  3. During the next session check in on life stress, mood, and anxiety.     [name], [name] Individual Treatment Plan - please see the notes from 3/28 for complete information     Problem/Need: Anxiety / stress - Analie reported a high level of anxiety and stress related to a situation with her son.    Long-Term Goal #1: Reduce level of general distress and anxiety to a more manageable level as evidenced by patient report   Short-Term Objectives: Objective 1A: Dacie will be able to  identify 5 coping resources/activities.  Objective 1B: Carmesha will be able to identify ineffective or negative coping  strategies. Objective 1C: Kayce will be able to replace ineffective or negative coping strategies with more adaptive strategies. Interventions: Cognitive Behavioral Therapy, Motivational Interviewing, and Psycho-education/Bibliotherapy  coping skills, and other evidenced-based practices will be used to promote progress towards healthy functioning and to help manage decrease symptoms associated with their diagnosis.  Treatment Regimen: Individual bi-weekly skill building sessions for assist with treatment goal/objective Target Date: 3/24 Responsible Party: therapist and patient Person delivering treatment: Licensed Psychologist [name], [name] will support the patient's ability to achieve the goals identified. Resolved:  No -as of January 2024 Nardos has made significant progress in recognizing ineffective coping strategies but, as noted previously, given the ongoing nature of the stressors that she is experiencing the goal has not fully been reached  Problem/Need: Communication challenges - Isley reported some negative communication with her family members.  Long-Term Goal #2: Francetta will develop effective communication strategies to use with her family members.  Short-Term Objectives: Objective 2A: Demiya will be able to identify communication breakdowns with her family members .  Objective 2B: Savana will be able to practice different communication strategies in session . Objective 2C: Cydnee will be able to will try some of these communication strategies with her family members. Interventions: Cognitive Behavioral Therapy, Roleplay, and Motivational Interviewing  and other evidenced-based practices will be used to promote progress towards healthy functioning and to help manage decrease symptoms associated with their diagnosis.  Treatment Regimen: Individual bi-weekly skill building sessions to work on treatment goal/objective Target Date: 3/24 Responsible Party: therapist and  patient Person delivering treatment: Licensed Psychologist [name], [name] will support the patient's ability to achieve the goals identified. Resolved:  No - Heylee has also made progress on this goal but again given the ongoing nature of the stressors and the high level of tension this goal has not fully been achieved    [name], [name]

## 2023-02-02 ENCOUNTER — Ambulatory Visit: Payer: BC Managed Care – PPO | Admitting: Clinical

## 2023-02-11 ENCOUNTER — Ambulatory Visit: Payer: BC Managed Care – PPO | Admitting: Clinical

## 2023-02-11 DIAGNOSIS — F4322 Adjustment disorder with anxiety: Secondary | ICD-10-CM | POA: Diagnosis not present

## 2023-02-11 NOTE — Progress Notes (Addendum)
[place] Counselor/Therapist Progress Note  Patient ID: Nicole Boyd, MRN: [id number]    Date: 02/11/23  Time Spent: 9:02 am - 9:50 am: 48 Minutes  Type of Service Provided Individual Therapy  Type of Contact in-person Location: office  Mental Status Exam: Appearance:  Neat and Well Groomed     Behavior: Appropriate and Sharing  Motor: Normal  Speech/Language:  Clear and Coherent and Normal Rate  Affect: Appropriate  Mood: normal  Thought process: goal directed  Thought content:   WNL  Sensory/Perceptual disturbances:   WNL  Orientation: oriented to person, place, time/date, and situation  Attention: Good  Concentration: Good  Memory: WNL  Fund of knowledge:  Good  Insight:   Fair to good  Judgment:  Good  Impulse Control: Good   Risk Assessment: No apparent indicators of SI or HI during the visit  Presenting Problems, Reported Symptoms, and /or Interim History: Nicole Boyd presented for a session to address life stress.   Subjective: Nicole Boyd presented for an individual outpatient therapy session. The following was addressed during sessions.   Nicole Boyd reported that a number of things have changed since the last visit, including her son starting a new school. In general the changes have been positive, but Nicole Boyd reported feeling some anxiety about how long the positive trajectory would last. She has been able to gather support from her friend group and talked about how she has been changing her thinking to help reframe her situation.     Interventions/Psychotherapy Techniques Used During Session: Cognitive Behavioral Therapy and Solution-Oriented/Positive Psychology  Diagnosis: Adjustment disorder with anxious mood  MENTAL HEALTH INTERVENTIONS USED DURING TREATMENT & PATIENT'S RESPONSE TO INTERVENTIONS:  Short-term Objective addressed today:Nicole Boyd will be able to replace ineffective or negative coping strategies with more adaptive strategies. Mental health  techniques used: Objective was addressed in session through the use of Cognitive Behavioral Therapy and Solution-Oriented/Positive Psychology and discussion. Nicole Boyd's response was positive.  Progress Toward Goal: progressing     PLAN  1. Nicole Boyd will return for a therapy session.   2. Homework Given: continue to use strategies to manage stress, get more information about what to expect regarding adjustment to school from child's school team. This homework will be reviewed with Nicole Boyd at the next visit.  3. During the next session check in on life stress and anxiety.     [name], [name]  Individual Treatment Plan - please see the notes from 3/28 for complete information     Problem/Need: Anxiety / stress - Nicole Boyd reported a high level of anxiety and stress related to a situation with her son.    Long-Term Goal #1: Reduce level of general distress and anxiety to a more manageable level as evidenced by patient report   Short-Term Objectives: Objective 1A: Nicole Boyd will be able to identify 5 coping resources/activities.  Objective 1B: Nicole Boyd will be able to identify ineffective or negative coping strategies. Objective 1C: Nicole Boyd will be able to replace ineffective or negative coping strategies with more adaptive strategies. Interventions: Cognitive Behavioral Therapy, Motivational Interviewing, and Psycho-education/Bibliotherapy  coping skills, and other evidenced-based practices will be used to promote progress towards healthy functioning and to help manage decrease symptoms associated with their diagnosis.  Treatment Regimen: Individual bi-weekly skill building sessions for assist with treatment goal/objective Target Date: 3/24 Responsible Party: therapist and patient Person delivering treatment: Licensed Psychologist [name], [name] will support the patient's ability to achieve the goals identified. Resolved:  No -as of January 2024 Nicole Boyd has made significant  progress in recognizing  ineffective coping strategies but, as noted previously, given the ongoing nature of the stressors that she is experiencing the goal has not fully been reached  Problem/Need: Communication challenges - Nicole Boyd reported some negative communication with her family members.  Long-Term Goal #2: Nicole Boyd will develop effective communication strategies to use with her family members.  Short-Term Objectives: Objective 2A: Nicole Boyd will be able to identify communication breakdowns with her family members .  Objective 2B: Nicole Boyd will be able to practice different communication strategies in session . Objective 2C: Nicole Boyd will be able to will try some of these communication strategies with her family members. Interventions: Cognitive Behavioral Therapy, Roleplay, and Motivational Interviewing  and other evidenced-based practices will be used to promote progress towards healthy functioning and to help manage decrease symptoms associated with their diagnosis.  Treatment Regimen: Individual bi-weekly skill building sessions to work on treatment goal/objective Target Date: 3/24 Responsible Party: therapist and patient Person delivering treatment: Licensed Psychologist [name], [name] will support the patient's ability to achieve the goals identified. Resolved:  No - Nicole Boyd has also made progress on this goal but again given the ongoing nature of the stressors and the high level of tension this goal has not fully been achieved  [name], [name]

## 2023-03-02 IMAGING — MG MM DIGITAL SCREENING BILAT W/ TOMO AND CAD
8 series · 9 of 24 positions shown · non-contrast
Comparison: Previous exam(s).

CLINICAL DATA: Screening.

EXAM:
DIGITAL SCREENING BILATERAL MAMMOGRAM WITH TOMOSYNTHESIS AND CAD
TECHNIQUE: Bilateral screening digital craniocaudal and mediolateral oblique
mammograms were obtained. Bilateral screening digital breast
tomosynthesis was performed. The images were evaluated with
computer-aided detection.

[R CC synth-2D]
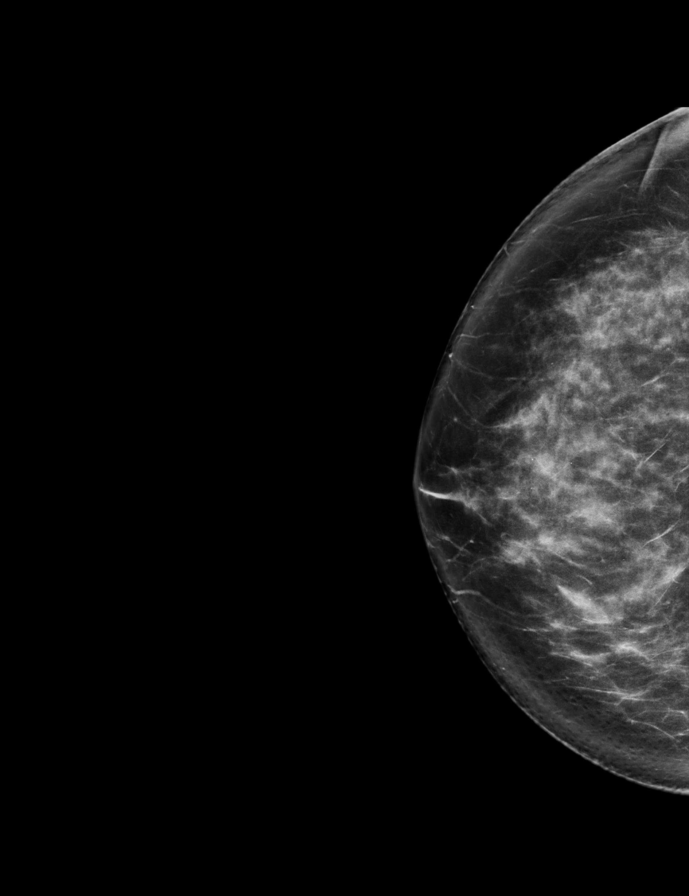

[L MLO synth-2D]
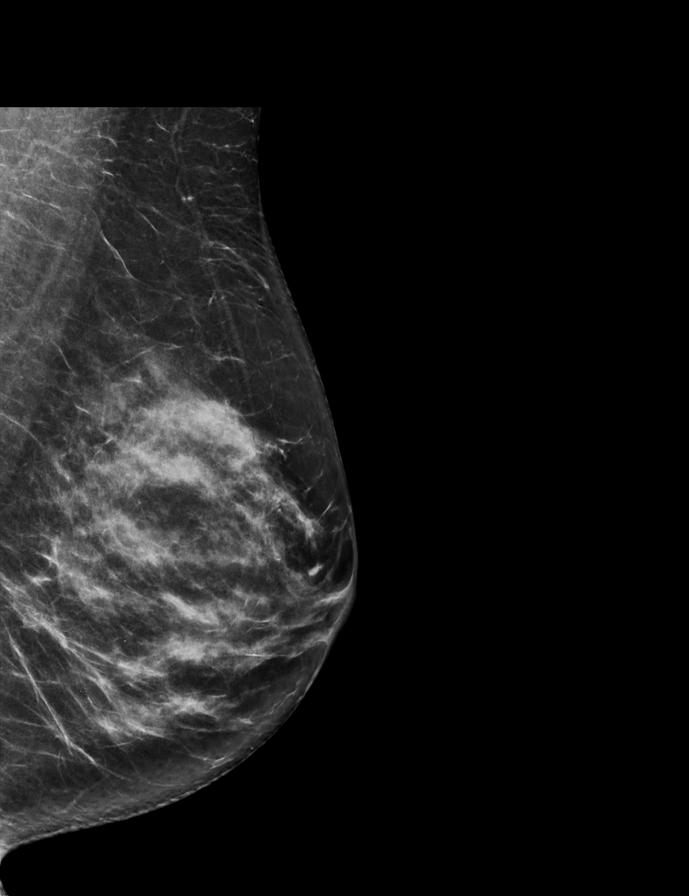

[R MLO synth-2D]
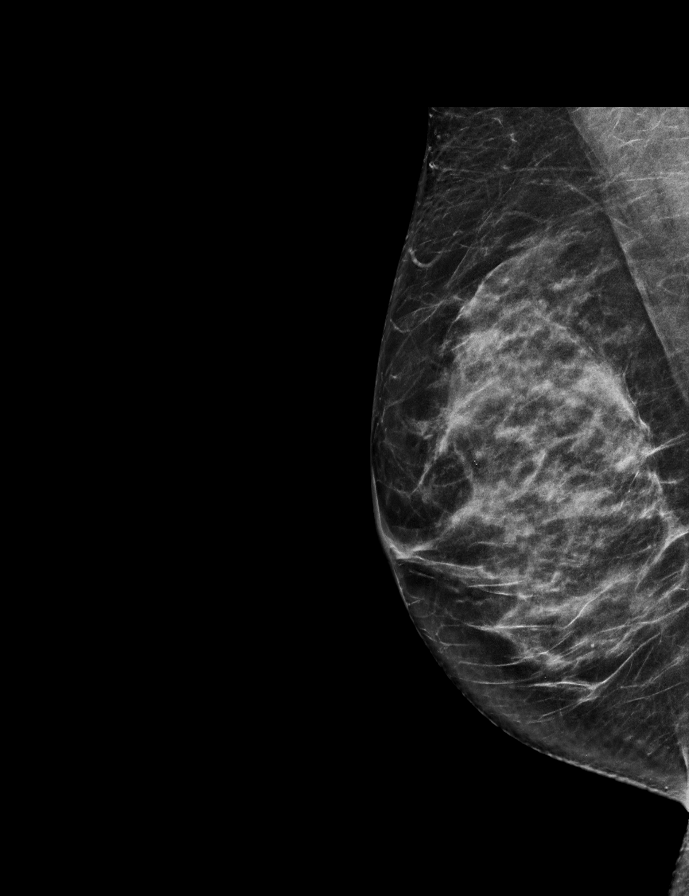

[L CC synth-2D]
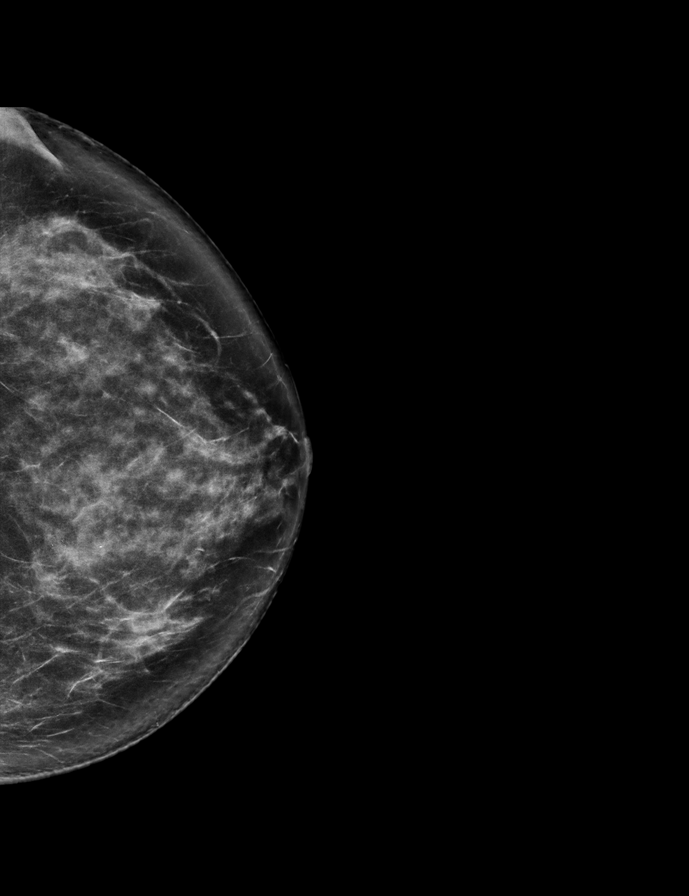

[L MLO tomo · 2 of 68 frames shown]
[frame 22/68]
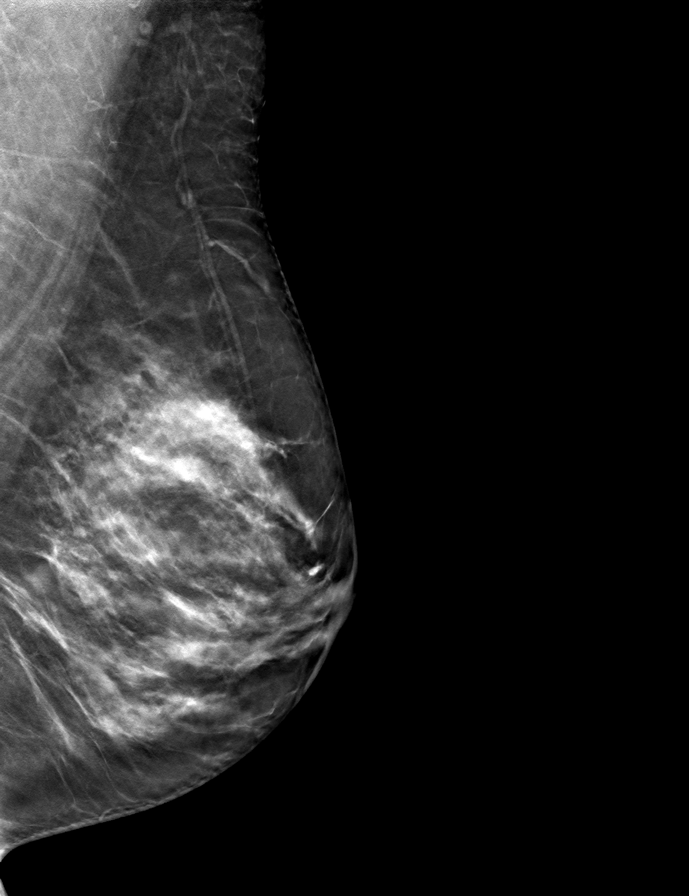
[frame 35/68]
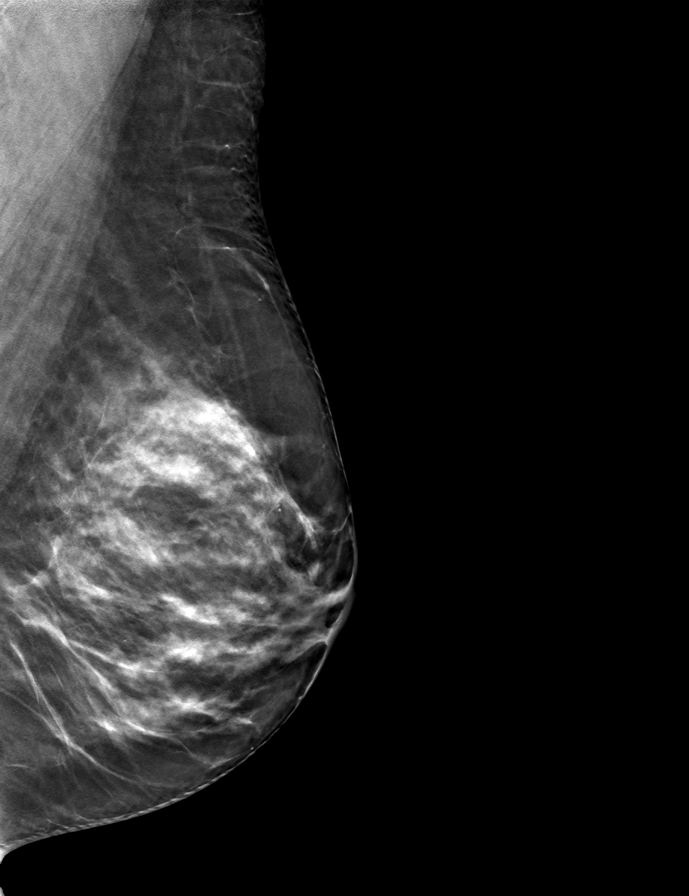

[L CC tomo · tomo slice 37/72.0]
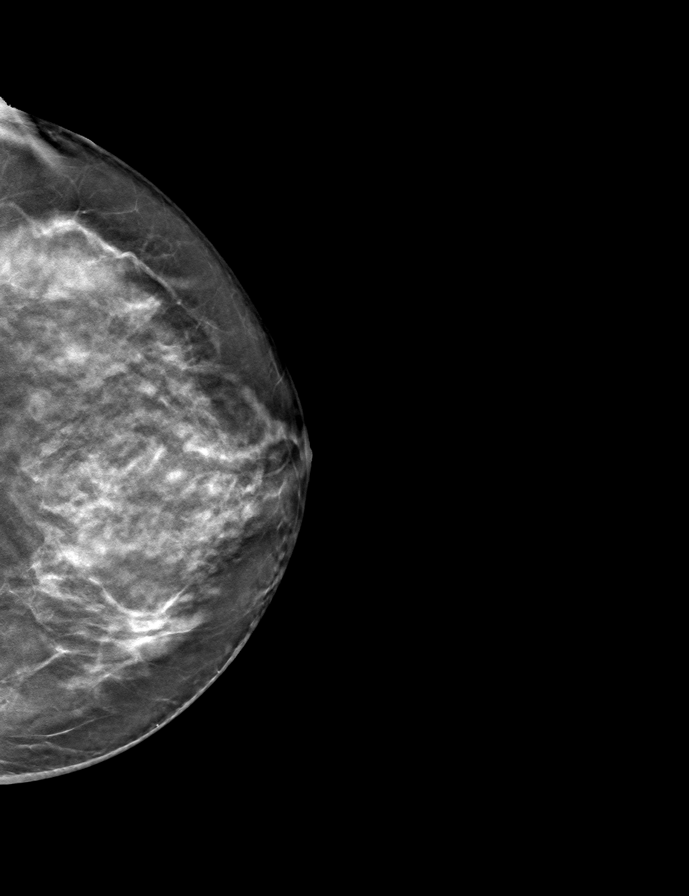

[R MLO tomo · tomo slice 34/67.0]
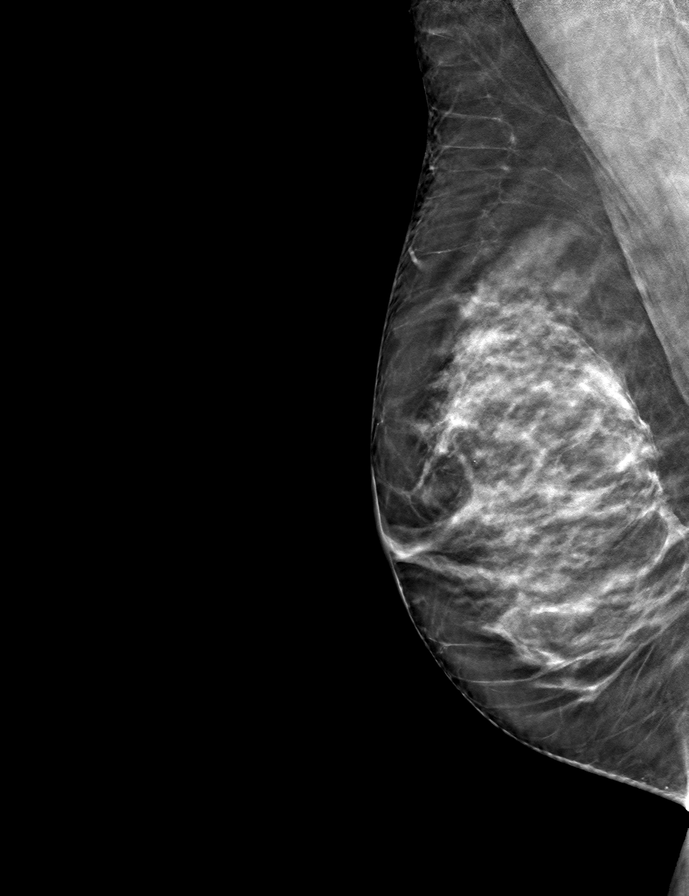

[R CC tomo · tomo slice 37/73.0]
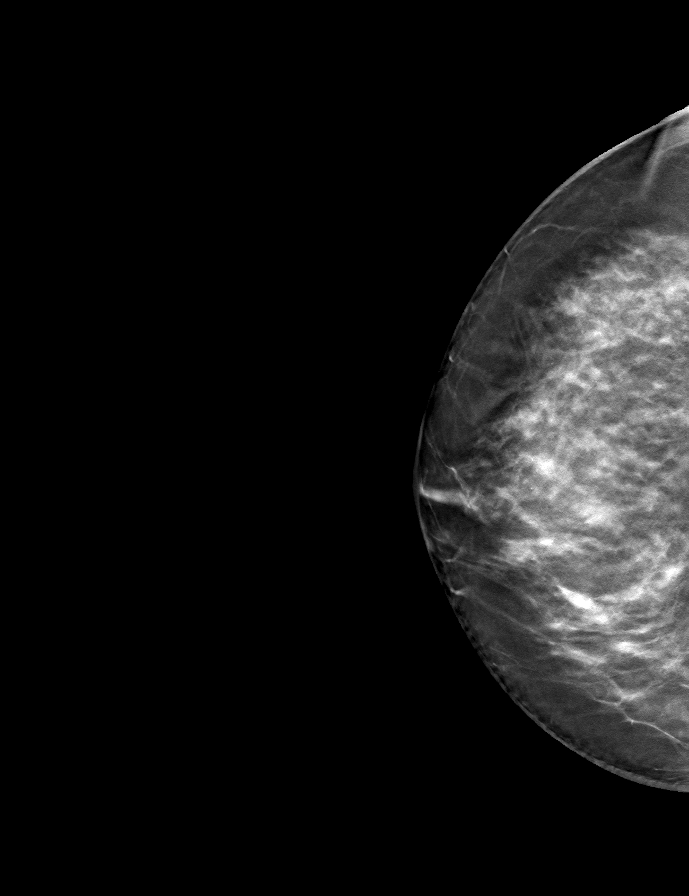

[9 of 24 positions shown; findings below may reference images not displayed]

ACR Breast Density Category c: The breast tissue is heterogeneously
dense, which may obscure small masses.
FINDINGS: There are no findings suspicious for malignancy.
IMPRESSION: No mammographic evidence of malignancy. A result letter of this
screening mammogram will be mailed directly to the patient.

RECOMMENDATION:
Screening mammogram in one year. (Code:Q3-W-BC3)

BI-RADS CATEGORY  1: Negative.

## 2023-03-03 ENCOUNTER — Ambulatory Visit: Payer: BC Managed Care – PPO | Admitting: Clinical

## 2023-03-03 DIAGNOSIS — F4322 Adjustment disorder with anxiety: Secondary | ICD-10-CM

## 2023-03-03 NOTE — Progress Notes (Signed)
[place] Counselor/Therapist Progress Note  Patient ID: Nicole Boyd, MRN: [id number]    Date: 03/03/23  Time Spent: 8:30 am - 9:28 am: 38 Minutes  Type of Service Provided Individual Therapy  Type of Contact in-person Location: office  Mental Status Exam: Appearance:  Neat and Well Groomed     Behavior: Appropriate  Motor: Normal  Speech/Language:  Clear and Coherent and Normal Rate  Affect: Appropriate  Mood: normal  Thought process: goal directed  Thought content:   WNL  Sensory/Perceptual disturbances:   WNL  Orientation: oriented to person, place, time/date, and situation  Attention: Good  Concentration: Good  Memory: WNL  Fund of knowledge:  Good  Insight:   Good  Judgment:  Good  Impulse Control: Good   Risk Assessment: No apparent indicators of SI or HI during the visit  Presenting Problems, Reported Symptoms, and /or Interim History: Nicole Boyd presented for a session to address life stress.   Subjective: Nicole Boyd presented for an individual outpatient therapy session. The following was addressed during sessions.   Nicole Boyd reported that her son has continued to do well at his new school. Work stressors were discussed as well as a plan for continued management of these stressors and how to give herself the time to consider what she is doing at work and further carve out her role. Now that the overall level of stress that she has been experiencing has started to decrease, looking for opportunities to gradually build in positive daily or weekly activities (for her self and in her relationship) in a slow, plan full way was discussed to help facilitate long term mental health.    Interventions/Psychotherapy Techniques Used During Session: Cognitive Behavioral Therapy, Assertiveness/Communication, and Solution-Oriented/Positive Psychology  Diagnosis: Adjustment disorder with anxious mood  MENTAL HEALTH INTERVENTIONS USED DURING TREATMENT & PATIENT'S RESPONSE TO  INTERVENTIONS:  Short-term Objective addressed today:Nicole Boyd will be able to replace ineffective or negative coping strategies with more adaptive strategies. Mental health techniques used: Objective was addressed in session through the use of  Cognitive Behavioral Therapy and discussion. Nicole Boyd's response was positive.  Progress Toward Goal: progressing  Short-term Objective addressed today:Nicole Boyd will be able to replace ineffective or negative coping strategies with more adaptive strategies. Mental health techniques used: Objective was addressed in session through the use of  Assertiveness/Communication and discussion. Nicole Boyd's response was positive.  Progress Toward Goal: progressing    PLAN  1. Nicole Boyd will return for a therapy session.   2. Homework Given: take time to herself to sit and contemplate how she wants her work life to look and how to carve out her role, start putting in small activities that can help with long term mental heath (e.g., walking twice a week, or more involved cooking once a week, etc). . This homework will be reviewed with Nicole Boyd at the next visit.  3. During the next session heck in on life stress including home stress and work stress.     [name], [name]  Individual Treatment Plan - please see the notes from 3/28 for complete information     Problem/Need: Anxiety / stress - Nicole Boyd reported a high level of anxiety and stress related to a situation with her son.    Long-Term Goal #1: Reduce level of general distress and anxiety to a more manageable level as evidenced by patient report   Short-Term Objectives: Objective 1A: Nicole Boyd will be able to identify 5 coping resources/activities.  Objective 1B: Nicole Boyd will be able to identify ineffective or negative  coping strategies. Objective 1C: Nicole Boyd will be able to replace ineffective or negative coping strategies with more adaptive strategies. Interventions: Cognitive Behavioral Therapy, Motivational Interviewing,  and Psycho-education/Bibliotherapy  coping skills, and other evidenced-based practices will be used to promote progress towards healthy functioning and to help manage decrease symptoms associated with their diagnosis.  Treatment Regimen: Individual bi-weekly skill building sessions for assist with treatment goal/objective Target Date: 3/24 - target date changed to 05/2023 Responsible Party: therapist and patient Person delivering treatment: Licensed Psychologist [name], [name] will support the patient's ability to achieve the goals identified. Resolved:  No -as of January 2024 Nicole Boyd has made significant progress in recognizing ineffective coping strategies but, as noted previously, given the ongoing nature of the stressors that she had experiencing the goal has not fully been reached  Problem/Need: Communication challenges - Nicole Boyd reported some negative communication with her family members.  Long-Term Goal #2: [name] will develop effective communication strategies to use with her family members.  Short-Term Objectives: Objective 2A: [name] will be able to identify communication breakdowns with her family members .  Objective 2B: [name] will be able to practice different communication strategies in session . Objective 2C: [name] will be able to will try some of these communication strategies with her family members. Interventions: Cognitive Behavioral Therapy, Roleplay, and Motivational Interviewing  and other evidenced-based practices will be used to promote progress towards healthy functioning and to help manage decrease symptoms associated with their diagnosis.  Treatment Regimen: Individual bi-weekly skill building sessions to work on treatment goal/objective Target Date: 3/24 - target date changed to 05/2023 Responsible Party: therapist and patient Person delivering treatment: Licensed Psychologist [name], [name] will support the patient's ability to achieve the goals  identified. Resolved:  No - [name] has also made progress on this goal but again given the ongoing nature of the stressors and the high level of tension this goal has not fully been achieved   [name], [name]

## 2023-03-05 DIAGNOSIS — S91302A Unspecified open wound, left foot, initial encounter: Secondary | ICD-10-CM | POA: Diagnosis not present

## 2023-04-08 DIAGNOSIS — S1096XA Insect bite of unspecified part of neck, initial encounter: Secondary | ICD-10-CM | POA: Diagnosis not present

## 2023-04-08 DIAGNOSIS — W57XXXA Bitten or stung by nonvenomous insect and other nonvenomous arthropods, initial encounter: Secondary | ICD-10-CM | POA: Diagnosis not present

## 2023-04-08 DIAGNOSIS — L989 Disorder of the skin and subcutaneous tissue, unspecified: Secondary | ICD-10-CM | POA: Diagnosis not present

## 2023-04-08 DIAGNOSIS — Z1152 Encounter for screening for COVID-19: Secondary | ICD-10-CM | POA: Diagnosis not present

## 2023-04-08 DIAGNOSIS — R509 Fever, unspecified: Secondary | ICD-10-CM | POA: Diagnosis not present

## 2023-04-13 ENCOUNTER — Ambulatory Visit: Payer: BC Managed Care – PPO | Admitting: Clinical

## 2023-05-19 DIAGNOSIS — L821 Other seborrheic keratosis: Secondary | ICD-10-CM | POA: Diagnosis not present

## 2023-05-19 DIAGNOSIS — L820 Inflamed seborrheic keratosis: Secondary | ICD-10-CM | POA: Diagnosis not present

## 2023-05-19 DIAGNOSIS — L603 Nail dystrophy: Secondary | ICD-10-CM | POA: Diagnosis not present

## 2023-05-19 DIAGNOSIS — D2272 Melanocytic nevi of left lower limb, including hip: Secondary | ICD-10-CM | POA: Diagnosis not present

## 2023-05-19 DIAGNOSIS — I781 Nevus, non-neoplastic: Secondary | ICD-10-CM | POA: Diagnosis not present

## 2023-05-19 DIAGNOSIS — D485 Neoplasm of uncertain behavior of skin: Secondary | ICD-10-CM | POA: Diagnosis not present

## 2023-07-26 DIAGNOSIS — E785 Hyperlipidemia, unspecified: Secondary | ICD-10-CM | POA: Diagnosis not present

## 2023-08-01 DIAGNOSIS — Z1212 Encounter for screening for malignant neoplasm of rectum: Secondary | ICD-10-CM | POA: Diagnosis not present

## 2023-08-02 DIAGNOSIS — F909 Attention-deficit hyperactivity disorder, unspecified type: Secondary | ICD-10-CM | POA: Diagnosis not present

## 2023-08-02 DIAGNOSIS — R82998 Other abnormal findings in urine: Secondary | ICD-10-CM | POA: Diagnosis not present

## 2023-08-02 DIAGNOSIS — Z0000 Encounter for general adult medical examination without abnormal findings: Secondary | ICD-10-CM | POA: Diagnosis not present

## 2023-08-02 DIAGNOSIS — Z1331 Encounter for screening for depression: Secondary | ICD-10-CM | POA: Diagnosis not present

## 2023-08-02 DIAGNOSIS — Z1339 Encounter for screening examination for other mental health and behavioral disorders: Secondary | ICD-10-CM | POA: Diagnosis not present

## 2023-09-14 ENCOUNTER — Other Ambulatory Visit: Payer: Self-pay | Admitting: Internal Medicine

## 2023-09-14 DIAGNOSIS — Z1231 Encounter for screening mammogram for malignant neoplasm of breast: Secondary | ICD-10-CM

## 2023-10-20 ENCOUNTER — Ambulatory Visit
Admission: RE | Admit: 2023-10-20 | Discharge: 2023-10-20 | Disposition: A | Discharge status: Home / Self Care | Payer: BC Managed Care – PPO | Source: Ambulatory Visit | Attending: Internal Medicine | Admitting: Internal Medicine

## 2023-10-20 DIAGNOSIS — Z1231 Encounter for screening mammogram for malignant neoplasm of breast: Secondary | ICD-10-CM

## 2024-02-11 DIAGNOSIS — S63651A Sprain of metacarpophalangeal joint of left index finger, initial encounter: Secondary | ICD-10-CM | POA: Diagnosis not present

## 2024-02-11 DIAGNOSIS — M79641 Pain in right hand: Secondary | ICD-10-CM | POA: Diagnosis not present

## 2024-02-11 DIAGNOSIS — M79644 Pain in right finger(s): Secondary | ICD-10-CM | POA: Diagnosis not present

## 2024-02-11 DIAGNOSIS — S63653A Sprain of metacarpophalangeal joint of left middle finger, initial encounter: Secondary | ICD-10-CM | POA: Diagnosis not present

## 2024-03-07 DIAGNOSIS — S63653A Sprain of metacarpophalangeal joint of left middle finger, initial encounter: Secondary | ICD-10-CM | POA: Diagnosis not present

## 2024-03-07 DIAGNOSIS — S63651A Sprain of metacarpophalangeal joint of left index finger, initial encounter: Secondary | ICD-10-CM | POA: Diagnosis not present

## 2024-03-20 DIAGNOSIS — J040 Acute laryngitis: Secondary | ICD-10-CM | POA: Diagnosis not present

## 2024-03-20 DIAGNOSIS — R051 Acute cough: Secondary | ICD-10-CM | POA: Diagnosis not present

## 2024-03-20 DIAGNOSIS — J069 Acute upper respiratory infection, unspecified: Secondary | ICD-10-CM | POA: Diagnosis not present

## 2024-03-28 DIAGNOSIS — S6991XD Unspecified injury of right wrist, hand and finger(s), subsequent encounter: Secondary | ICD-10-CM | POA: Diagnosis not present

## 2024-04-10 ENCOUNTER — Other Ambulatory Visit: Payer: Self-pay | Admitting: Orthopedic Surgery

## 2024-04-10 DIAGNOSIS — S6991XA Unspecified injury of right wrist, hand and finger(s), initial encounter: Secondary | ICD-10-CM

## 2024-04-11 ENCOUNTER — Other Ambulatory Visit: Payer: Self-pay | Admitting: Orthopedic Surgery

## 2024-04-11 DIAGNOSIS — S6991XA Unspecified injury of right wrist, hand and finger(s), initial encounter: Secondary | ICD-10-CM

## 2024-04-27 ENCOUNTER — Ambulatory Visit
Admission: RE | Admit: 2024-04-27 | Discharge: 2024-04-27 | Disposition: A | Discharge status: Home / Self Care | Source: Ambulatory Visit | Attending: Orthopedic Surgery | Admitting: Orthopedic Surgery

## 2024-04-27 ENCOUNTER — Other Ambulatory Visit

## 2024-04-27 DIAGNOSIS — S6991XA Unspecified injury of right wrist, hand and finger(s), initial encounter: Secondary | ICD-10-CM

## 2024-04-27 DIAGNOSIS — M25549 Pain in joints of unspecified hand: Secondary | ICD-10-CM | POA: Diagnosis not present

## 2024-04-27 MED ORDER — IOPAMIDOL (ISOVUE-M 200) INJECTION 41%
0.7000 mL | Freq: Once | INTRAMUSCULAR | Status: AC
  Administered 2024-04-27: 0.7 mL via INTRA_ARTICULAR

## 2024-05-02 ENCOUNTER — Other Ambulatory Visit: Payer: Self-pay | Admitting: Orthopedic Surgery

## 2024-05-02 DIAGNOSIS — S6991XD Unspecified injury of right wrist, hand and finger(s), subsequent encounter: Secondary | ICD-10-CM | POA: Diagnosis not present

## 2024-05-10 ENCOUNTER — Encounter (HOSPITAL_BASED_OUTPATIENT_CLINIC_OR_DEPARTMENT_OTHER): Payer: Self-pay | Admitting: Orthopedic Surgery

## 2024-05-11 NOTE — Progress Notes (Signed)

## 2024-05-18 ENCOUNTER — Ambulatory Visit (HOSPITAL_BASED_OUTPATIENT_CLINIC_OR_DEPARTMENT_OTHER): Admitting: Anesthesiology

## 2024-05-18 ENCOUNTER — Other Ambulatory Visit: Payer: Self-pay

## 2024-05-18 ENCOUNTER — Ambulatory Visit (HOSPITAL_BASED_OUTPATIENT_CLINIC_OR_DEPARTMENT_OTHER)
Admission: RE | Admit: 2024-05-18 | Discharge: 2024-05-18 | Disposition: A | Discharge status: Home / Self Care | Attending: Orthopedic Surgery | Admitting: Orthopedic Surgery

## 2024-05-18 ENCOUNTER — Encounter (HOSPITAL_BASED_OUTPATIENT_CLINIC_OR_DEPARTMENT_OTHER): Payer: Self-pay | Admitting: Orthopedic Surgery

## 2024-05-18 ENCOUNTER — Encounter (HOSPITAL_BASED_OUTPATIENT_CLINIC_OR_DEPARTMENT_OTHER)
Admission: RE | Disposition: A | Discharge status: Home / Self Care | Payer: Self-pay | Source: Home / Self Care | Attending: Orthopedic Surgery

## 2024-05-18 DIAGNOSIS — Z01818 Encounter for other preprocedural examination: Secondary | ICD-10-CM

## 2024-05-18 DIAGNOSIS — X58XXXA Exposure to other specified factors, initial encounter: Secondary | ICD-10-CM | POA: Insufficient documentation

## 2024-05-18 DIAGNOSIS — S5331XA Traumatic rupture of right ulnar collateral ligament, initial encounter: Secondary | ICD-10-CM | POA: Diagnosis not present

## 2024-05-18 DIAGNOSIS — F909 Attention-deficit hyperactivity disorder, unspecified type: Secondary | ICD-10-CM | POA: Diagnosis not present

## 2024-05-18 DIAGNOSIS — S6991XD Unspecified injury of right wrist, hand and finger(s), subsequent encounter: Secondary | ICD-10-CM | POA: Diagnosis not present

## 2024-05-18 DIAGNOSIS — S63692A Other sprain of right middle finger, initial encounter: Secondary | ICD-10-CM | POA: Insufficient documentation

## 2024-05-18 DIAGNOSIS — S63412A Traumatic rupture of collateral ligament of right middle finger at metacarpophalangeal and interphalangeal joint, initial encounter: Secondary | ICD-10-CM | POA: Diagnosis not present

## 2024-05-18 HISTORY — PX: RECONSTRUCTION, LIGAMENT, COLLATERAL, MCP JOINT: SHX7626

## 2024-05-18 SURGERY — RECONSTRUCTION, LIGAMENT, COLLATERAL, MCP JOINT
Anesthesia: Monitor Anesthesia Care | Site: Middle Finger | Laterality: Right

## 2024-05-18 MED ORDER — MIDAZOLAM HCL 2 MG/2ML IJ SOLN
INTRAMUSCULAR | Status: AC
  Filled 2024-05-18: qty 2

## 2024-05-18 MED ORDER — BUPIVACAINE-EPINEPHRINE (PF) 0.5% -1:200000 IJ SOLN
INTRAMUSCULAR | Status: DC | PRN
Start: 2024-05-18 — End: 2024-05-18
  Administered 2024-05-18: 30 mL via PERINEURAL

## 2024-05-18 MED ORDER — PROPOFOL 500 MG/50ML IV EMUL
INTRAVENOUS | Status: DC | PRN
  Administered 2024-05-18: 100 ug/kg/min via INTRAVENOUS

## 2024-05-18 MED ORDER — MEPERIDINE HCL 25 MG/ML IJ SOLN: 6.2500 mg | INTRAMUSCULAR | Status: DC | PRN

## 2024-05-18 MED ORDER — LACTATED RINGERS IV SOLN: INTRAVENOUS | Status: DC

## 2024-05-18 MED ORDER — DEXMEDETOMIDINE HCL IN NACL 80 MCG/20ML IV SOLN
INTRAVENOUS | Status: DC | PRN
  Administered 2024-05-18 (×3): 4 ug via INTRAVENOUS

## 2024-05-18 MED ORDER — CEFAZOLIN SODIUM-DEXTROSE 2-4 GM/100ML-% IV SOLN
2.0000 g | INTRAVENOUS | Status: AC
  Administered 2024-05-18: 2 g via INTRAVENOUS

## 2024-05-18 MED ORDER — 0.9 % SODIUM CHLORIDE (POUR BTL) OPTIME
TOPICAL | Status: DC | PRN
  Administered 2024-05-18: 1000 mL

## 2024-05-18 MED ORDER — OXYCODONE HCL 5 MG PO TABS: 5.0000 mg | ORAL_TABLET | Freq: Once | ORAL | Status: DC | PRN

## 2024-05-18 MED ORDER — OXYCODONE HCL 5 MG/5ML PO SOLN: 5.0000 mg | Freq: Once | ORAL | Status: DC | PRN

## 2024-05-18 MED ORDER — ONDANSETRON HCL 4 MG/2ML IJ SOLN
INTRAMUSCULAR | Status: DC | PRN
  Administered 2024-05-18: 4 mg via INTRAVENOUS

## 2024-05-18 MED ORDER — ACETAMINOPHEN 500 MG PO TABS
ORAL_TABLET | ORAL | Status: AC
  Filled 2024-05-18: qty 2

## 2024-05-18 MED ORDER — MIDAZOLAM HCL 2 MG/2ML IJ SOLN: 0.5000 mg | Freq: Once | INTRAMUSCULAR | Status: DC | PRN

## 2024-05-18 MED ORDER — HYDROCODONE-ACETAMINOPHEN 5-325 MG PO TABS: 1.0000 | ORAL_TABLET | Freq: Four times a day (QID) | ORAL | 0 refills | Status: AC | PRN

## 2024-05-18 MED ORDER — FENTANYL CITRATE (PF) 100 MCG/2ML IJ SOLN
INTRAMUSCULAR | Status: AC
  Filled 2024-05-18: qty 2

## 2024-05-18 MED ORDER — MIDAZOLAM HCL 2 MG/2ML IJ SOLN
2.0000 mg | Freq: Once | INTRAMUSCULAR | Status: AC
  Administered 2024-05-18: 2 mg via INTRAVENOUS

## 2024-05-18 MED ORDER — FENTANYL CITRATE (PF) 100 MCG/2ML IJ SOLN
100.0000 ug | Freq: Once | INTRAMUSCULAR | Status: AC
  Administered 2024-05-18: 100 ug via INTRAVENOUS

## 2024-05-18 MED ORDER — FENTANYL CITRATE (PF) 100 MCG/2ML IJ SOLN: 25.0000 ug | INTRAMUSCULAR | Status: DC | PRN

## 2024-05-18 MED ORDER — PROPOFOL 500 MG/50ML IV EMUL
INTRAVENOUS | Status: AC
  Filled 2024-05-18: qty 50

## 2024-05-18 MED ORDER — ACETAMINOPHEN 500 MG PO TABS
1000.0000 mg | ORAL_TABLET | Freq: Once | ORAL | Status: AC
  Administered 2024-05-18: 1000 mg via ORAL

## 2024-05-18 MED ORDER — CEFAZOLIN SODIUM-DEXTROSE 2-4 GM/100ML-% IV SOLN
INTRAVENOUS | Status: AC
  Filled 2024-05-18: qty 100

## 2024-05-18 SURGICAL SUPPLY — 65 items
ANCHOR JUGGERKNOT 1.0 1DR 2-0 (Anchor) IMPLANT
BENZOIN TINCTURE PRP APPL 2/3 (GAUZE/BANDAGES/DRESSINGS) IMPLANT
BLADE MINI RND TIP GREEN BEAV (BLADE) ×1 IMPLANT
BLADE SURG 15 STRL LF DISP TIS (BLADE) ×2 IMPLANT
BNDG ELASTIC 2INX 5YD STR LF (GAUZE/BANDAGES/DRESSINGS) IMPLANT
BNDG ELASTIC 3INX 5YD STR LF (GAUZE/BANDAGES/DRESSINGS) IMPLANT
BNDG ESMARK 4X9 LF (GAUZE/BANDAGES/DRESSINGS) ×1 IMPLANT
BNDG GAUZE DERMACEA FLUFF 4 (GAUZE/BANDAGES/DRESSINGS) ×1 IMPLANT
CATH ROBINSON RED A/P 8FR (CATHETERS) IMPLANT
CHLORAPREP W/TINT 26 (MISCELLANEOUS) ×1 IMPLANT
CORD BIPOLAR FORCEPS 12FT (ELECTRODE) ×1 IMPLANT
COVER BACK TABLE 60X90IN (DRAPES) ×1 IMPLANT
COVER MAYO STAND STRL (DRAPES) ×1 IMPLANT
CUFF TOURN SGL QUICK 18X4 (TOURNIQUET CUFF) ×1 IMPLANT
DRAPE EXTREMITY T 121X128X90 (DISPOSABLE) ×1 IMPLANT
DRAPE OEC MINIVIEW 54X84 (DRAPES) IMPLANT
DRAPE SURG 17X23 STRL (DRAPES) ×1 IMPLANT
GAUZE PAD ABD 8X10 STRL (GAUZE/BANDAGES/DRESSINGS) IMPLANT
GAUZE SPONGE 4X4 12PLY STRL (GAUZE/BANDAGES/DRESSINGS) ×1 IMPLANT
GAUZE XEROFORM 1X8 LF (GAUZE/BANDAGES/DRESSINGS) ×1 IMPLANT
GLOVE BIO SURGEON STRL SZ7.5 (GLOVE) ×1 IMPLANT
GLOVE BIOGEL PI IND STRL 8 (GLOVE) ×1 IMPLANT
GLOVE BIOGEL PI IND STRL 8.5 (GLOVE) ×1 IMPLANT
GLOVE SURG ORTHO 8.0 STRL STRW (GLOVE) IMPLANT
GOWN STRL REUS W/ TWL LRG LVL3 (GOWN DISPOSABLE) ×1 IMPLANT
GOWN STRL REUS W/TWL XL LVL3 (GOWN DISPOSABLE) ×1 IMPLANT
KWIRE DBL .035X4 NSTRL (WIRE) IMPLANT
NDL HYPO 22X1.5 SAFETY MO (MISCELLANEOUS) IMPLANT
NDL HYPO 25X1 1.5 SAFETY (NEEDLE) IMPLANT
NDL KEITH (NEEDLE) IMPLANT
NDL SAFETY ECLIPSE 18X1.5 (NEEDLE) IMPLANT
NEEDLE HYPO 22X1.5 SAFETY MO (MISCELLANEOUS) IMPLANT
NEEDLE HYPO 25X1 1.5 SAFETY (NEEDLE) IMPLANT
NEEDLE KEITH (NEEDLE) IMPLANT
NS IRRIG 1000ML POUR BTL (IV SOLUTION) ×1 IMPLANT
PACK BASIN DAY SURGERY FS (CUSTOM PROCEDURE TRAY) ×1 IMPLANT
PAD CAST 3X4 CTTN HI CHSV (CAST SUPPLIES) ×1 IMPLANT
PAD CAST 4YDX4 CTTN HI CHSV (CAST SUPPLIES) IMPLANT
PADDING CAST ABS COTTON 4X4 ST (CAST SUPPLIES) ×1 IMPLANT
PASSER SUT SWANSON 36MM LOOP (INSTRUMENTS) IMPLANT
SLEEVE SCD COMPRESS KNEE MED (STOCKING) ×1 IMPLANT
SLING ARM FOAM STRAP LRG (SOFTGOODS) IMPLANT
SPIKE FLUID TRANSFER (MISCELLANEOUS) IMPLANT
SPLINT PLASTER CAST FAST 5X30 (CAST SUPPLIES) IMPLANT
SPLINT PLASTER CAST XFAST 3X15 (CAST SUPPLIES) IMPLANT
STOCKINETTE 4X48 STRL (DRAPES) ×1 IMPLANT
STRIP CLOSURE SKIN 1/2X4 (GAUZE/BANDAGES/DRESSINGS) IMPLANT
SUT CHROMIC 4 0 S 4 (SUTURE) IMPLANT
SUT ETHIBOND 3-0 V-5 (SUTURE) IMPLANT
SUT ETHILON 3 0 PS 1 (SUTURE) IMPLANT
SUT ETHILON 4 0 PS 2 18 (SUTURE) IMPLANT
SUT MERSILENE 2.0 SH NDLE (SUTURE) IMPLANT
SUT MERSILENE 4 0 P 3 (SUTURE) IMPLANT
SUT MON AB 5-0 P3 18 (SUTURE) IMPLANT
SUT SILK 4 0 PS 2 (SUTURE) IMPLANT
SUT VIC AB 2-0 SH 27XBRD (SUTURE) IMPLANT
SUT VIC AB 4-0 PS2 18 (SUTURE) IMPLANT
SUT VIC AB 4-0 RB1 27X BRD (SUTURE) IMPLANT
SUT VICRYL 0 SH 27 (SUTURE) IMPLANT
SUTURE FIBERWR 2-0 18 17.9 3/8 (SUTURE) IMPLANT
SUTURE FIBERWR 3-0 18 TAPR NDL (SUTURE) IMPLANT
SYR BULB EAR ULCER 3OZ GRN STR (SYRINGE) ×1 IMPLANT
SYR CONTROL 10ML LL (SYRINGE) IMPLANT
TOWEL GREEN STERILE FF (TOWEL DISPOSABLE) ×2 IMPLANT
UNDERPAD 30X36 HEAVY ABSORB (UNDERPADS AND DIAPERS) ×1 IMPLANT

## 2024-05-18 NOTE — Anesthesia Postprocedure Evaluation (Signed)
 Anesthesia Post Note  Patient: Nicole Boyd  Procedure(s) Performed: RECONSTRUCTION, LIGAMENT, COLLATERAL, MCP JOINT RIGHT LONG FINGER (Right: Middle Finger)     Patient location during evaluation: Phase II Anesthesia Type: Regional Level of consciousness: awake and alert, oriented and patient cooperative Pain management: pain level controlled Vital Signs Assessment: post-procedure vital signs reviewed and stable Respiratory status: spontaneous breathing, nonlabored ventilation and respiratory function stable Cardiovascular status: stable and blood pressure returned to baseline Postop Assessment: no apparent nausea or vomiting, adequate PO intake and able to ambulate Anesthetic complications: no   No notable events documented.  Last Vitals:  Vitals:   05/18/24 1345 05/18/24 1400  BP: 118/64 93/82  Pulse: 61 63  Resp: 16 16  Temp:  (!) 36.2 C  SpO2: 94% 100%    Last Pain:  Vitals:   05/18/24 1400  TempSrc:   PainSc: 0-No pain                 Glenford Garis,E. Brynnley Dayrit

## 2024-05-18 NOTE — H&P (Signed)
  Nicole Boyd is an 63 y.o. female.   Chief Complaint: collateral ligament tear HPI: 63 yo female with right lon gfinger collateral ligament injury.  MRI confirms tear.  She has pain at the mp joint and wishes to proceed with surgical ligament repair.  Allergies: No Known Allergies  Past Medical History:  Diagnosis Date   ADHD     Past Surgical History:  Procedure Laterality Date   CARPAL TUNNEL RELEASE Right 12/05/2015   Procedure: CARPAL TUNNEL RELEASE;  Surgeon: Lyanne Sample, MD;  Location: [facility];  Service: Orthopedics;  Laterality: Right;   DILATION AND CURETTAGE OF UTERUS     TYMPANOPLASTY      Family History: Family History  Problem Relation Age of Onset   Hypertension Mother    Heart disease Mother    Lung disease Mother    Hypertension Father    Heart disease Father    Ovarian cancer Paternal Aunt 19 - 81   BRCA 1/2 Neg Hx    Breast cancer Neg Hx     Social History:   reports that she has never smoked. She has never used smokeless tobacco. She reports current alcohol  use. She reports that she does not use drugs.  Medications: Medications Prior to Admission  Medication Sig Dispense Refill   methylphenidate (RITALIN) 10 MG tablet Take 10 mg by mouth daily.     traZODone (DESYREL) 50 MG tablet Take 50 mg by mouth at bedtime.      No results found for this or any previous visit (from the past 48 hours).  No results found.    Blood pressure 113/80, pulse 61, temperature (!) 97.3 F (36.3 C), temperature source Temporal, resp. rate 16, height 5\' 8"  (1.727 m), weight 77.7 kg, last menstrual period 08/14/2013, SpO2 99%.  General appearance: alert, cooperative, and appears stated age Head: Normocephalic, without obvious abnormality, atraumatic Neck: supple, symmetrical, trachea midline Extremities: Intact sensation and capillary refill all digits.  +epl/fpl/io.  No wounds.  Skin: Skin color, texture, turgor normal. No rashes or  lesions Neurologic: Grossly normal Incision/Wound: none  Assessment/Plan Right long finger collateral ligament tear.  Non operative and operative treatment options have been discussed with the patient and patient wishes to proceed with operative treatment. Risks, benefits, and alternatives of surgery have been discussed and the patient agrees with the plan of care.   [name] 05/18/2024, 11:48 AM

## 2024-05-18 NOTE — Transfer of Care (Signed)
 Immediate Anesthesia Transfer of Care Note  Patient: Nicole Boyd  Procedure(s) Performed: RECONSTRUCTION, LIGAMENT, COLLATERAL, MCP JOINT RIGHT LONG FINGER (Right: Middle Finger)  Patient Location: PACU  Anesthesia Type:MAC and Regional  Level of Consciousness: awake, alert , oriented, and patient cooperative  Airway & Oxygen Therapy: Patient Spontanous Breathing  Post-op Assessment: Report given to RN and Post -op Vital signs reviewed and stable  Post vital signs: Reviewed and stable  Last Vitals:  Vitals Value Taken Time  BP    Temp    Pulse 67 05/18/24 1326  Resp 18 05/18/24 1326  SpO2 94 % 05/18/24 1326  Vitals shown include unfiled device data.  Last Pain:  Vitals:   05/18/24 0959  TempSrc: Temporal  PainSc: 0-No pain         Complications: No notable events documented.

## 2024-05-18 NOTE — Anesthesia Procedure Notes (Signed)
 Anesthesia Regional Block: Supraclavicular block   Pre-Anesthetic Checklist: , timeout performed,  Correct Patient, Correct Site, Correct Laterality,  Correct Procedure, Correct Position, site marked,  Risks and benefits discussed,  Surgical consent,  Pre-op evaluation,  At surgeon's request and post-op pain management  Laterality: Right and Upper  Prep: chloraprep       Needles:  Injection technique: Single-shot  Needle Type: Echogenic Needle     Needle Length: 9cm  Needle Gauge: 21     Additional Needles:   Procedures:,,,, ultrasound used (permanent image in chart),,    Narrative:  Start time: 05/18/2024 11:36 AM End time: 05/18/2024 11:41 AM Injection made incrementally with aspirations every 5 mL.  Performed by: Personally  Anesthesiologist: Jonne Netters, MD  Additional Notes: Pt identified in Holding room.  Monitors applied. Working IV access confirmed. Timeout, Sterile prep R clavicle and neck.  #21ga ECHOgenic Arrow block needle to supraclavicular brachial plexus with US  guidance.  30cc 0.5% Bupivacaine  1:200k epi injected incrementally after negative test dose.  Patient asymptomatic, VSS, no heme aspirated, tolerated well.   Fay Hoop, MD

## 2024-05-18 NOTE — Discharge Instructions (Addendum)
 [facility] Instructions Hand Surgery  Wound Care: Keep your hand elevated above the level of your heart.  Do not allow it to dangle by your side.  Keep the dressing dry and do not remove it unless your doctor advises you to do so.  He will usually change it at the time of your post-op visit.  Moving your fingers is advised to stimulate circulation but will depend on the site of your surgery.  If you have a splint applied, your doctor will advise you regarding movement.  Activity: Do not drive or operate machinery today.  Rest today and then you may return to your normal activity and work as indicated by your physician.  Diet:  Drink liquids today or eat a light diet.  You may resume a regular diet tomorrow.    General expectations: Pain for two to three days. Fingers may become slightly swollen.  Call your doctor if any of the following occur: Severe pain not relieved by pain medication. Elevated temperature. Dressing soaked with blood. Inability to move fingers. White or bluish color to fingers.     Post Anesthesia Home Care Instructions  Activity: Get plenty of rest for the remainder of the day. A responsible individual must stay with you for 24 hours following the procedure.  For the next 24 hours, DO NOT: -Drive a car -Advertising copywriter -Drink alcoholic beverages -Take any medication unless instructed by your physician -Make any legal decisions or sign important papers.  Meals: Start with liquid foods such as gelatin or soup. Progress to regular foods as tolerated. Avoid greasy, spicy, heavy foods. If nausea and/or vomiting occur, drink only clear liquids until the nausea and/or vomiting subsides. Call your physician if vomiting continues.  Special Instructions/Symptoms: Your throat may feel dry or sore from the anesthesia or the breathing tube placed in your throat during surgery. If this causes discomfort, gargle with warm salt water. The discomfort should disappear  within 24 hours.  If you had a scopolamine  patch placed behind your ear for the management of post- operative nausea and/or vomiting:  1. The medication in the patch is effective for 72 hours, after which it should be removed.  Wrap patch in a tissue and discard in the trash. Wash hands thoroughly with soap and water. 2. You may remove the patch earlier than 72 hours if you experience unpleasant side effects which may include dry mouth, dizziness or visual disturbances. 3. Avoid touching the patch. Wash your hands with soap and water after contact with the patch.     Regional Anesthesia Blocks  1. You may not be able to move or feel the "blocked" extremity after a regional anesthetic block. This may last may last from 3-48 hours after placement, but it will go away. The length of time depends on the medication injected and your individual response to the medication. As the nerves start to wake up, you may experience tingling as the movement and feeling returns to your extremity. If the numbness and inability to move your extremity has not gone away after 48 hours, please call your surgeon.   2. The extremity that is blocked will need to be protected until the numbness is gone and the strength has returned. Because you cannot feel it, you will need to take extra care to avoid injury. Because it may be weak, you may have difficulty moving it or using it. You may not know what position it is in without looking at it while the block is  in effect.  3. For blocks in the legs and feet, returning to weight bearing and walking needs to be done carefully. You will need to wait until the numbness is entirely gone and the strength has returned. You should be able to move your leg and foot normally before you try and bear weight or walk. You will need someone to be with you when you first try to ensure you do not fall and possibly risk injury.  4. Bruising and tenderness at the needle site are common side  effects and will resolve in a few days.  5. Persistent numbness or new problems with movement should be communicated to the surgeon or the [facility] [phone] [facility] [phone]).   Tylenol  can be taken after 4 pm if needed

## 2024-05-18 NOTE — Op Note (Signed)
 NAME: Nicole Boyd MEDICAL RECORD NO: [id number] DATE OF BIRTH: 1961/06/05 FACILITY: [facility] LOCATION: [facility] PHYSICIAN: Rony Ratz R. Raiquan Chandler, MD   OPERATIVE REPORT   DATE OF PROCEDURE: 05/18/24    PREOPERATIVE DIAGNOSIS: Right long finger ulnar collateral ligament tear   POSTOPERATIVE DIAGNOSIS: Right long finger ulnar collateral ligament tear   PROCEDURE: Right long finger ulnar collateral ligament repair   SURGEON:  Brunilda Capra, M.D.   ASSISTANT: Lyanne Sample, MD   ANESTHESIA:  Regional with sedation   INTRAVENOUS FLUIDS:  Per anesthesia flow sheet.   ESTIMATED BLOOD LOSS:  Minimal.   COMPLICATIONS:  None.   SPECIMENS:  none   TOURNIQUET TIME:    Total Tourniquet Time Documented: Upper Arm (Right) - 33 minutes Total: Upper Arm (Right) - 33 minutes    DISPOSITION:  Stable to PACU.   INDICATIONS: 63 year old female with right long finger ulnar collateral ligament tear.  This is painful and bothersome to her.  She wished to have surgical repair.  Risks, benefits and alternatives of surgery were discussed including the risks of blood loss, infection, damage to nerves, vessels, tendons, ligaments, bone for surgery, need for additional surgery, complications with wound healing, continued pain, stiffness, , recurrence.  She voiced understanding of these risks and elected to proceed.  OPERATIVE COURSE:  After being identified preoperatively by myself,  the patient and I agreed on the procedure and site of the procedure.  The surgical site was marked.  Surgical consent had been signed. Preoperative IV antibiotic prophylaxis was given. She was transferred to the operating room and placed on the operating table in supine position with the Right upper extremity on an arm board.  Sedation was induced by the anesthesiologist. A regional block had been performed by anesthesia in preoperative holding.    Right upper extremity was prepped and draped in normal sterile  orthopedic fashion.  A surgical pause was performed between the surgeons, anesthesia, and operating room staff and all were in agreement as to the patient, procedure, and site of procedure.  Tourniquet at the proximal aspect of the extremity was inflated to 250 mmHg after exsanguination of the arm with an Esmarch bandage.  Incision was made over the MP joint of the long finger.  This is carried in subcutaneous tissues by spreading technique.  The ulnar sagittal bands were identified and released.  The capsule was opened.  The joint was inspected.  The articular surface was intact.  The ulnar collateral ligament was torn off from the footprint at the base of the proximal phalanx.  The radial collateral ligament was not able to be visualized but was palpated and felt intact.  The footprint at the base of the ulnar side of the proximal phalanx was roughened up with the curette.  The juggernaut suture anchors were used.  1.0 anchor was selected.  The guidepin was used to drill a hole in the footprint of the proximal phalanx.  The anchor was then placed.  There was good purchase.  The sutures were used to reapproximate the torn ulnar collateral ligament back to the footprint at the base of the proximal phalanx.  This provided good stability of the finger.  The wound was irrigated with sterile saline.  The capsule was repaired with 4-0 chromic suture in a running fashion.  The sagittal bands were repaired with 4-0 Mersilene in a running fashion.  A inverted interrupted 4-0 Vicryl suture was placed in subcutaneous tissues and skin was closed with a running subcuticular  5-0 Monocryl suture.  This was augmented with benzoin and Steri-Strips.  The wound was dressed with sterile 4 x 4's and wrapped with a Kerlix bandage.  A volar splint was placed and wrapped with Kerlix and Ace bandage.  The tourniquet was deflated at 33 minutes.  Fingertips were pink with brisk capillary refill after deflation of tourniquet.  The operative   drapes were broken down.  The patient was awoken from anesthesia safely.  She was transferred back to the stretcher and taken to PACU in stable condition.  I will see her back in the office in 1 week for postoperative followup.  I will give her a prescription for Norco 5/325 1 tab PO q6 hours prn pain, dispense #15.   Aliviana Burdell, MD Electronically signed, 05/18/24

## 2024-05-18 NOTE — Op Note (Signed)
 I assisted Surgeons and Role:    * Brunilda Capra, MD - Primary    * Lyanne Sample, MD - Assisting on the Procedure(s): RECONSTRUCTION, LIGAMENT, COLLATERAL, MCP JOINT RIGHT LONG FINGER on 05/18/2024.  I provided assistance on this case as follows: Set up, approach, fixation of the collateral ligament tear, placement of the anchor, repair of the ligament, closure of the wound and application of the dressing and splint.  Electronically signed by: Lyanne Sample, MD Date: 05/18/2024 Time: 1:20 PM

## 2024-05-18 NOTE — Anesthesia Preprocedure Evaluation (Addendum)
 Anesthesia Evaluation  Patient identified by MRN, date of birth, ID band Patient awake    Reviewed: Allergy & Precautions, NPO status , Patient's Chart, lab work & pertinent test results  History of Anesthesia Complications Negative for: history of anesthetic complications  Airway Mallampati: II  TM Distance: >3 FB Neck ROM: Full    Dental  (+) Dental Advisory Given   Pulmonary neg pulmonary ROS   breath sounds clear to auscultation       Cardiovascular (-) angina negative cardio ROS  Rhythm:Regular Rate:Normal     Neuro/Psych  PSYCHIATRIC DISORDERS (ADHD)      negative neurological ROS     GI/Hepatic negative GI ROS, Neg liver ROS,,,  Endo/Other  negative endocrine ROS    Renal/GU negative Renal ROS     Musculoskeletal   Abdominal   Peds  Hematology negative hematology ROS (+)   Anesthesia Other Findings   Reproductive/Obstetrics                             Anesthesia Physical Anesthesia Plan  ASA: 2  Anesthesia Plan: MAC and Regional   Post-op Pain Management: Tylenol  PO (pre-op)* and Regional block*   Induction: Intravenous  PONV Risk Score and Plan: 3 and Ondansetron  and Dexamethasone  Airway Management Planned: Natural Airway and Simple Face Mask  Additional Equipment: None  Intra-op Plan:   Post-operative Plan:   Informed Consent: I have reviewed the patients History and Physical, chart, labs and discussed the procedure including the risks, benefits and alternatives for the proposed anesthesia with the patient or authorized representative who has indicated his/her understanding and acceptance.     Dental advisory given  Plan Discussed with: CRNA and Surgeon  Anesthesia Plan Comments: (Plan routine monitors,  supraclavicular block with MAC)        Anesthesia Quick Evaluation

## 2024-05-18 NOTE — Progress Notes (Signed)
Assisted Dr. Carswell Jackson with right, supraclavicular, ultrasound guided block. Side rails up, monitors on throughout procedure. See vital signs in flow sheet. Tolerated Procedure well. ?

## 2024-05-19 ENCOUNTER — Encounter (HOSPITAL_BASED_OUTPATIENT_CLINIC_OR_DEPARTMENT_OTHER): Payer: Self-pay | Admitting: Orthopedic Surgery

## 2024-05-25 DIAGNOSIS — S6991XD Unspecified injury of right wrist, hand and finger(s), subsequent encounter: Secondary | ICD-10-CM | POA: Diagnosis not present

## 2024-06-02 DIAGNOSIS — S6991XD Unspecified injury of right wrist, hand and finger(s), subsequent encounter: Secondary | ICD-10-CM | POA: Diagnosis not present

## 2024-06-30 DIAGNOSIS — S6991XD Unspecified injury of right wrist, hand and finger(s), subsequent encounter: Secondary | ICD-10-CM | POA: Diagnosis not present

## 2024-08-08 DIAGNOSIS — S6991XD Unspecified injury of right wrist, hand and finger(s), subsequent encounter: Secondary | ICD-10-CM | POA: Diagnosis not present

## 2024-08-15 DIAGNOSIS — D2272 Melanocytic nevi of left lower limb, including hip: Secondary | ICD-10-CM | POA: Diagnosis not present

## 2024-08-15 DIAGNOSIS — D485 Neoplasm of uncertain behavior of skin: Secondary | ICD-10-CM | POA: Diagnosis not present

## 2024-08-15 DIAGNOSIS — L738 Other specified follicular disorders: Secondary | ICD-10-CM | POA: Diagnosis not present

## 2024-08-15 DIAGNOSIS — D225 Melanocytic nevi of trunk: Secondary | ICD-10-CM | POA: Diagnosis not present

## 2024-08-15 DIAGNOSIS — I8391 Asymptomatic varicose veins of right lower extremity: Secondary | ICD-10-CM | POA: Diagnosis not present

## 2024-08-15 DIAGNOSIS — L821 Other seborrheic keratosis: Secondary | ICD-10-CM | POA: Diagnosis not present

## 2024-08-15 DIAGNOSIS — B353 Tinea pedis: Secondary | ICD-10-CM | POA: Diagnosis not present

## 2024-08-15 DIAGNOSIS — I8392 Asymptomatic varicose veins of left lower extremity: Secondary | ICD-10-CM | POA: Diagnosis not present

## 2024-08-15 DIAGNOSIS — D1801 Hemangioma of skin and subcutaneous tissue: Secondary | ICD-10-CM | POA: Diagnosis not present

## 2024-08-15 DIAGNOSIS — L720 Epidermal cyst: Secondary | ICD-10-CM | POA: Diagnosis not present

## 2024-08-15 DIAGNOSIS — L918 Other hypertrophic disorders of the skin: Secondary | ICD-10-CM | POA: Diagnosis not present

## 2024-08-15 DIAGNOSIS — D2372 Other benign neoplasm of skin of left lower limb, including hip: Secondary | ICD-10-CM | POA: Diagnosis not present

## 2024-08-24 DIAGNOSIS — D649 Anemia, unspecified: Secondary | ICD-10-CM | POA: Diagnosis not present

## 2024-08-24 DIAGNOSIS — Z0000 Encounter for general adult medical examination without abnormal findings: Secondary | ICD-10-CM | POA: Diagnosis not present

## 2024-08-24 DIAGNOSIS — Z1339 Encounter for screening examination for other mental health and behavioral disorders: Secondary | ICD-10-CM | POA: Diagnosis not present

## 2024-08-24 DIAGNOSIS — I8393 Asymptomatic varicose veins of bilateral lower extremities: Secondary | ICD-10-CM | POA: Diagnosis not present

## 2024-08-24 DIAGNOSIS — F909 Attention-deficit hyperactivity disorder, unspecified type: Secondary | ICD-10-CM | POA: Diagnosis not present

## 2024-08-24 DIAGNOSIS — Z1331 Encounter for screening for depression: Secondary | ICD-10-CM | POA: Diagnosis not present

## 2024-08-24 DIAGNOSIS — E785 Hyperlipidemia, unspecified: Secondary | ICD-10-CM | POA: Diagnosis not present

## 2024-08-24 DIAGNOSIS — Z23 Encounter for immunization: Secondary | ICD-10-CM | POA: Diagnosis not present

## 2024-08-24 DIAGNOSIS — G479 Sleep disorder, unspecified: Secondary | ICD-10-CM | POA: Diagnosis not present

## 2024-08-24 DIAGNOSIS — R82998 Other abnormal findings in urine: Secondary | ICD-10-CM | POA: Diagnosis not present

## 2024-08-24 DIAGNOSIS — Z780 Asymptomatic menopausal state: Secondary | ICD-10-CM | POA: Diagnosis not present

## 2024-08-30 ENCOUNTER — Other Ambulatory Visit (HOSPITAL_BASED_OUTPATIENT_CLINIC_OR_DEPARTMENT_OTHER): Payer: Self-pay | Admitting: Internal Medicine

## 2024-08-30 DIAGNOSIS — E785 Hyperlipidemia, unspecified: Secondary | ICD-10-CM

## 2024-09-05 ENCOUNTER — Other Ambulatory Visit: Payer: Self-pay | Admitting: Internal Medicine

## 2024-09-05 DIAGNOSIS — Z1231 Encounter for screening mammogram for malignant neoplasm of breast: Secondary | ICD-10-CM

## 2024-10-20 ENCOUNTER — Ambulatory Visit
Admission: RE | Admit: 2024-10-20 | Discharge: 2024-10-20 | Disposition: A | Discharge status: Home / Self Care | Source: Ambulatory Visit | Attending: Internal Medicine | Admitting: Internal Medicine

## 2024-10-20 DIAGNOSIS — Z1231 Encounter for screening mammogram for malignant neoplasm of breast: Secondary | ICD-10-CM
